# Patient Record
Sex: Female | Born: 1995 | Race: Black or African American | Hispanic: No | Marital: Single | Smoking: Never smoker
Health system: Southern US, Community
[De-identification: ages and names within clinical notes are randomized; demographics above are authoritative.]

---

## 2020-08-22 ENCOUNTER — Emergency Department
Admission: EM | Admit: 2020-08-22 | Discharge: 2020-08-22 | Disposition: A | Discharge status: Home / Self Care | Payer: 59 | Attending: Emergency Medicine | Admitting: Emergency Medicine

## 2020-08-22 ENCOUNTER — Encounter: Payer: Self-pay | Admitting: Emergency Medicine

## 2020-08-22 ENCOUNTER — Other Ambulatory Visit: Payer: Self-pay

## 2020-08-22 DIAGNOSIS — R519 Headache, unspecified: Secondary | ICD-10-CM | POA: Insufficient documentation

## 2020-08-22 DIAGNOSIS — G8929 Other chronic pain: Secondary | ICD-10-CM

## 2020-08-22 LAB — CBC
HCT: 40.8 % (ref 36.0–46.0)
Hemoglobin: 12.7 g/dL (ref 12.0–15.0)
MCH: 24.9 pg — ABNORMAL LOW (ref 26.0–34.0)
MCHC: 31.1 g/dL (ref 30.0–36.0)
MCV: 79.8 fL — ABNORMAL LOW (ref 80.0–100.0)
Platelets: 341 10*3/uL (ref 150–400)
RBC: 5.11 MIL/uL (ref 3.87–5.11)
RDW: 13.7 % (ref 11.5–15.5)
WBC: 4.5 10*3/uL (ref 4.0–10.5)
nRBC: 0 % (ref 0.0–0.2)

## 2020-08-22 LAB — BASIC METABOLIC PANEL
Anion gap: 8 (ref 5–15)
BUN: 19 mg/dL (ref 6–20)
CO2: 23 mmol/L (ref 22–32)
Calcium: 10.3 mg/dL (ref 8.9–10.3)
Chloride: 106 mmol/L (ref 98–111)
Creatinine, Ser: 0.58 mg/dL (ref 0.44–1.00)
GFR, Estimated: >60 mL/min (ref >60)
Glucose, Bld: 96 mg/dL (ref 70–99)
Potassium: 4.9 mmol/L (ref 3.5–5.1)
Sodium: 137 mmol/L (ref 135–145)

## 2020-08-22 MED ORDER — KETOROLAC TROMETHAMINE 30 MG/ML IJ SOLN
30.0000 mg | Freq: Once | INTRAMUSCULAR | Status: AC
  Administered 2020-08-22: 30 mg via INTRAVENOUS
  Filled 2020-08-22: qty 1

## 2020-08-22 NOTE — ED Provider Notes (Signed)
[facility] Emergency Department Provider Note   ____________________________________________    I have reviewed the triage vital signs and the nursing notes.   HISTORY  Chief Complaint Headache     HPI Cassie Pham is a 25 y.o. female who presents with complaints of a headache.  Patient reports that she has had intermittently over the last several months.  She reports extensive work-up at [facility] including an MRI which was normal for the symptoms.  Unable to verify this via care everywhere.  She was sent over from clinic for evaluation because she has multiple complaints primarily headache, also complains of some abdominal pain which is now resolved.  Denies fevers or chills, no dysuria, no nausea or vomiting.  No neuro deficits.  Reports improvement with NSAIDs in headache  History reviewed. No pertinent past medical history.  There are no problems to display for this patient.   History reviewed. No pertinent surgical history.  Prior to Admission medications   Not on File     Allergies Patient has no known allergies.  No family history on file.  Social History Social History   Tobacco Use   Smoking status: Never Smoker   Smokeless tobacco: Never Used  Substance Use Topics   Alcohol use: Never    Review of Systems  Constitutional: No fever/chills Eyes: No visual changes.  ENT: No sore throat. Cardiovascular: Denies chest pain. Respiratory: Denies shortness of breath. Gastrointestinal: As above Genitourinary: Negative for dysuria. Musculoskeletal: Negative for back pain. Skin: Negative for rash. Neurological: As above   ____________________________________________   PHYSICAL EXAM:  VITAL SIGNS: ED Triage Vitals  Enc Vitals Group     BP 08/22/20 1049 130/74     Pulse Rate 08/22/20 1049 (!) 108     Resp 08/22/20 1049 17     Temp 08/22/20 1049 98.4 F (36.9 C)     Temp Source 08/22/20 1049 Oral     SpO2 08/22/20  1049 100 %     Weight 08/22/20 1048 77.1 kg (170 lb)     Height 08/22/20 1048 1.88 m (6\' 2" )     Head Circumference --      Peak Flow --      Pain Score 08/22/20 1107 10     Pain Loc --      Pain Edu? --      Excl. in GC? --     Constitutional: Alert and oriented. No acute distress. Pleasant and interactive Eyes: Conjunctivae are normal.  Head: Atraumatic.  Cardiovascular: Normal rate, regular rhythm. Grossly normal heart sounds.  Good peripheral circulation. Respiratory: Normal respiratory effort.  No retractions Gastrointestinal: Soft and nontender. No distention.  No CVA tenderness.  Musculoskeletal: No lower extremity tenderness nor edema.  Warm and well perfused Neurologic:  Normal speech and language. No gross focal neurologic deficits are appreciated.  Skin:  Skin is warm, dry and intact. No rash noted. Psychiatric: Mood and affect are normal. Speech and behavior are normal.  ____________________________________________   LABS (all labs ordered are listed, but only abnormal results are displayed)  Labs Reviewed  CBC - Abnormal; Notable for the following components:      Result Value   MCV 79.8 (*)    MCH 24.9 (*)    All other components within normal limits  BASIC METABOLIC PANEL  URINALYSIS, COMPLETE (UACMP) WITH MICROSCOPIC   ____________________________________________  EKG   ____________________________________________  RADIOLOGY  None ____________________________________________   PROCEDURES  Procedure(s) performed: No  Procedures  Critical Care performed: No ____________________________________________   INITIAL IMPRESSION / ASSESSMENT AND PLAN / ED COURSE  Pertinent labs & imaging results that were available during my care of the patient were reviewed by me and considered in my medical decision making (see chart for details).  Patient presents with complaints as noted above, global throbbing headache better with lights off, positive  photosensitivity, treated with IM Toradol here with significant improvement.  Abdominal exam is benign  Lab work checked which is reassuring  No indication for emergent imaging at this time given recent extensive work-up, appropriate for discharge with outpatient follow-up    ____________________________________________   FINAL CLINICAL IMPRESSION(S) / ED DIAGNOSES  Final diagnoses:  Chronic nonintractable headache, unspecified headache type        Note:  This document was prepared using Dragon voice recognition software and may include unintentional dictation errors.   Jene Every, MD 08/22/20 [phone]

## 2020-08-22 NOTE — ED Triage Notes (Signed)
Patient to ER for c/o headache to right temple area. Patient also describes tingling sensation to both legs at back of legs.

## 2021-01-24 ENCOUNTER — Encounter: Payer: Self-pay | Admitting: Emergency Medicine

## 2021-01-24 ENCOUNTER — Other Ambulatory Visit: Payer: Self-pay

## 2021-01-24 ENCOUNTER — Emergency Department
Admission: EM | Admit: 2021-01-24 | Discharge: 2021-01-24 | Disposition: A | Discharge status: Home / Self Care | Payer: 59 | Attending: Emergency Medicine | Admitting: Emergency Medicine

## 2021-01-24 DIAGNOSIS — L249 Irritant contact dermatitis, unspecified cause: Secondary | ICD-10-CM | POA: Diagnosis not present

## 2021-01-24 DIAGNOSIS — R21 Rash and other nonspecific skin eruption: Secondary | ICD-10-CM | POA: Diagnosis present

## 2021-01-24 MED ORDER — TRIAMCINOLONE ACETONIDE 0.5 % EX OINT: 1.0000 "application " | TOPICAL_OINTMENT | Freq: Two times a day (BID) | CUTANEOUS | 0 refills | Status: DC

## 2021-01-24 NOTE — ED Provider Notes (Signed)
[facility] Emergency Department Provider Note   ____________________________________________    I have reviewed the triage vital signs and the nursing notes.   HISTORY  Chief Complaint Rash     HPI Cassie Pham is a 25 y.o. female who presents with complaints of rash to the tops of her feet bilaterally.  Patient reports this rash has been ongoing for the last month nearly ever since she was at the beach.  She wonders if the ocean water had something to do with it.  She has been putting an antifungal cream which may be helping over the last week.  No other complaints.  She describes the rash is quite itchy  History reviewed. No pertinent past medical history.  There are no problems to display for this patient.   History reviewed. No pertinent surgical history.  Prior to Admission medications   Medication Sig Start Date End Date Taking? Authorizing Provider  triamcinolone ointment (KENALOG) 0.5 % Apply 1 application topically 2 (two) times daily. 01/24/21  Yes Jene Every, MD     Allergies Patient has no known allergies.  No family history on file.  Social History Social History   Tobacco Use   Smoking status: Never   Smokeless tobacco: Never  Vaping Use   Vaping Use: Never used  Substance Use Topics   Alcohol use: Never    Review of Systems  Constitutional: No fever/chills      Musculoskeletal: No foot swelling, no joint Skin: As above     ____________________________________________   PHYSICAL EXAM:  VITAL SIGNS: ED Triage Vitals [01/24/21 1940]  Enc Vitals Group     BP      Pulse      Resp      Temp      Temp src      SpO2      Weight 81.6 kg (180 lb)     Height 1.88 m (6\' 2" )     Head Circumference      Peak Flow      Pain Score 0     Pain Loc      Pain Edu?      Excl. in GC?      Constitutional: Alert and oriented. No acute distress. Pleasant and interactive Eyes: Conjunctivae are normal.   Head: Atraumatic. Nose: No congestion/rhinnorhea. Mouth/Throat: Mucous membranes are moist.   Cardiovascular: Normal rate, regular rhythm.  Respiratory: Normal respiratory effort.  No retractions. Genitourinary: deferred Musculoskeletal: No lower extremity tenderness nor edema.   Neurologic:  Normal speech and language. No gross focal neurologic deficits are appreciated.   Skin:  Skin is warm, dry, raised rash, nonerythematous to the tops of both feet in the distribution of a sandal suspicious for contact dermatitis   ____________________________________________   LABS (all labs ordered are listed, but only abnormal results are displayed)  Labs Reviewed - No data to display ____________________________________________  EKG   ____________________________________________  RADIOLOGY   ____________________________________________   PROCEDURES  Procedure(s) performed: No  Procedures   Critical Care performed: No ____________________________________________   INITIAL IMPRESSION / ASSESSMENT AND PLAN / ED COURSE  Pertinent labs & imaging results that were available during my care of the patient were reviewed by me and considered in my medical decision making (see chart for details).   Exam is suspicious for contact dermatitis, will treat with steroid cream, outpatient follow-up as needed.   ____________________________________________   FINAL CLINICAL IMPRESSION(S) / ED DIAGNOSES  Final diagnoses:  Irritant  contact dermatitis, unspecified trigger      NEW MEDICATIONS STARTED DURING THIS VISIT:  New Prescriptions   TRIAMCINOLONE OINTMENT (KENALOG) 0.5 %    Apply 1 application topically 2 (two) times daily.     Note:  This document was prepared using Dragon voice recognition software and may include unintentional dictation errors.    Jene Every, MD 01/24/21 2008

## 2021-01-24 NOTE — ED Triage Notes (Signed)
Patient ambulatory to triage with steady gait, without difficulty or distress noted; pt reports itchy rash to top of feet since July after going to the beach

## 2021-03-10 ENCOUNTER — Emergency Department: Payer: 59

## 2021-03-10 ENCOUNTER — Encounter: Payer: Self-pay | Admitting: Emergency Medicine

## 2021-03-10 ENCOUNTER — Other Ambulatory Visit: Payer: Self-pay

## 2021-03-10 ENCOUNTER — Emergency Department
Admission: EM | Admit: 2021-03-10 | Discharge: 2021-03-10 | Disposition: A | Discharge status: Home / Self Care | Payer: 59 | Attending: Emergency Medicine | Admitting: Emergency Medicine

## 2021-03-10 DIAGNOSIS — R200 Anesthesia of skin: Secondary | ICD-10-CM | POA: Insufficient documentation

## 2021-03-10 DIAGNOSIS — M79604 Pain in right leg: Secondary | ICD-10-CM | POA: Diagnosis not present

## 2021-03-10 DIAGNOSIS — Y9367 Activity, basketball: Secondary | ICD-10-CM | POA: Diagnosis not present

## 2021-03-10 DIAGNOSIS — M7031 Other bursitis of elbow, right elbow: Secondary | ICD-10-CM

## 2021-03-10 DIAGNOSIS — M25521 Pain in right elbow: Secondary | ICD-10-CM | POA: Diagnosis present

## 2021-03-10 DIAGNOSIS — M7021 Olecranon bursitis, right elbow: Secondary | ICD-10-CM | POA: Diagnosis not present

## 2021-03-10 MED ORDER — HYDROCODONE-ACETAMINOPHEN 5-325 MG PO TABS: 1.0000 | ORAL_TABLET | Freq: Four times a day (QID) | ORAL | 0 refills | Status: DC | PRN

## 2021-03-10 MED ORDER — KETOROLAC TROMETHAMINE 60 MG/2ML IM SOLN
30.0000 mg | Freq: Once | INTRAMUSCULAR | Status: AC
  Administered 2021-03-10: 30 mg via INTRAMUSCULAR
  Filled 2021-03-10: qty 2

## 2021-03-10 MED ORDER — ACETAMINOPHEN 325 MG PO TABS
650.0000 mg | ORAL_TABLET | Freq: Once | ORAL | Status: AC
  Administered 2021-03-10: 650 mg via ORAL
  Filled 2021-03-10: qty 2

## 2021-03-10 MED ORDER — NAPROXEN 500 MG PO TABS: 500.0000 mg | ORAL_TABLET | Freq: Two times a day (BID) | ORAL | 0 refills | Status: DC

## 2021-03-10 NOTE — ED Notes (Signed)
Pt to desk to request pain medication care discussed with Dr. Dolores Frame, per Dr. Dolores Frame pt offered Tylenol. Pt rolled eyes and walked away from desk without response.

## 2021-03-10 NOTE — ED Provider Notes (Signed)
[facility] Emergency Department Provider Note   ____________________________________________   Event Date/Time   First MD Initiated Contact with Patient 03/10/21 [phone]     (approximate)  I have reviewed the triage vital signs and the nursing notes.   HISTORY  Chief Complaint Elbow Pain    HPI Cassie Pham is a 25 y.o. female who presents to the ED from home with a chief complaint of nontraumatic right elbow pain x1 month.  Exacerbated with movement.  Patient recently started playing basketball and the pain began at that time.  Initially complained of right leg pain to triage nurse but states right elbow pain is her chief complaint tonight.  She is right-hand dominant.  Complains of occasional numbness to her right pinky finger.  Denies extremity weakness.  Voices no other complaints or injuries.     Past medical history None  There are no problems to display for this patient.   History reviewed. No pertinent surgical history.  Prior to Admission medications   Medication Sig Start Date End Date Taking? Authorizing Provider  HYDROcodone-acetaminophen (NORCO) 5-325 MG tablet Take 1 tablet by mouth every 6 (six) hours as needed for moderate pain. 03/10/21  Yes Irean Hong, MD  naproxen (NAPROSYN) 500 MG tablet Take 1 tablet (500 mg total) by mouth 2 (two) times daily with a meal. 03/10/21  Yes Irean Hong, MD  triamcinolone ointment (KENALOG) 0.5 % Apply 1 application topically 2 (two) times daily. 01/24/21   Jene Every, MD    Allergies Patient has no known allergies.  History reviewed. No pertinent family history.  Social History Social History   Tobacco Use   Smoking status: Never   Smokeless tobacco: Never  Vaping Use   Vaping Use: Never used  Substance Use Topics   Alcohol use: Never    Review of Systems  Constitutional: No fever/chills Eyes: No visual changes. ENT: No sore throat. Cardiovascular: Denies chest  pain. Respiratory: Denies shortness of breath. Gastrointestinal: No abdominal pain.  No nausea, no vomiting.  No diarrhea.  No constipation. Genitourinary: Negative for dysuria. Musculoskeletal: Positive for right elbow pain.  Negative for back pain. Skin: Negative for rash. Neurological: Negative for headaches, focal weakness or numbness.   ____________________________________________   PHYSICAL EXAM:  VITAL SIGNS: ED Triage Vitals  Enc Vitals Group     BP 03/10/21 0416 (!) 164/86     Pulse Rate 03/10/21 0416 (!) 123     Resp 03/10/21 0416 20     Temp 03/10/21 0416 98 F (36.7 C)     Temp Source 03/10/21 0416 Oral     SpO2 03/10/21 0416 99 %     Weight --      Height --      Head Circumference --      Peak Flow --      Pain Score 03/10/21 0411 10     Pain Loc --      Pain Edu? --      Excl. in GC? --     Constitutional: Alert and oriented. Well appearing and in no acute distress. Eyes: Conjunctivae are normal. PERRL. EOMI. Head: Atraumatic. Nose: No congestion/rhinnorhea. Mouth/Throat: Mucous membranes are moist.   Neck: No stridor.   Cardiovascular: Normal rate, regular rhythm. Grossly normal heart sounds.  Good peripheral circulation. Respiratory: Normal respiratory effort.  No retractions. Lungs CTAB. Gastrointestinal: Soft and nontender. No distention. No abdominal bruits. No CVA tenderness. Musculoskeletal:  RUE: Right olecranon tender to palpation.  No appreciable swelling.  Limited range of motion secondary to pain.  2+ radial pulse.  Brisk, less than 5-second capillary refill. No lower extremity tenderness nor edema.  No joint effusions. Neurologic:  Normal speech and language. No gross focal neurologic deficits are appreciated. No gait instability. Skin:  Skin is warm, dry and intact. No rash noted. Psychiatric: Mood and affect are normal. Speech and behavior are normal.  ____________________________________________   LABS (all labs ordered are listed,  but only abnormal results are displayed)  Labs Reviewed - No data to display ____________________________________________  EKG  None ____________________________________________  RADIOLOGY I, Ademide Schaberg J, personally viewed and evaluated these images (plain radiographs) as part of my medical decision making, as well as reviewing the written report by the radiologist.  ED MD interpretation: Negative right elbow x-ray  Official radiology report(s): DG Elbow Complete Right  Result Date: 03/10/2021 CLINICAL DATA:  Elbow pain EXAM: RIGHT ELBOW - COMPLETE 3+ VIEW COMPARISON:  None. FINDINGS: There is no evidence of fracture, dislocation, or joint effusion. There is no evidence of arthropathy or other focal bone abnormality. Soft tissues are unremarkable. IMPRESSION: Negative. Electronically Signed   By: Guadlupe Spanish M.D.   On: 03/10/2021 05:03    ____________________________________________   PROCEDURES  Procedure(s) performed (including Critical Care):  Procedures   ____________________________________________   INITIAL IMPRESSION / ASSESSMENT AND PLAN / ED COURSE  As part of my medical decision making, I reviewed the following data within the electronic MEDICAL RECORD NUMBER Nursing notes reviewed and incorporated, Old chart reviewed, Notes from prior ED visits, and [place] Controlled Substance Database     25 year old female presenting with right elbow bursitis.  Will treat with NSAIDs, analgesia, sling and patient will follow up with orthopedics as needed.  Strict return precautions given.  Patient verbalizes understanding and agrees with plan of care.      ____________________________________________   FINAL CLINICAL IMPRESSION(S) / ED DIAGNOSES  Final diagnoses:  Bursitis of right elbow, unspecified bursa     ED Discharge Orders          Ordered    naproxen (NAPROSYN) 500 MG tablet  2 times daily with meals        03/10/21 0543    HYDROcodone-acetaminophen (NORCO)  5-325 MG tablet  Every 6 hours PRN        03/10/21 0543             Note:  This document was prepared using Dragon voice recognition software and may include unintentional dictation errors.    Irean Hong, MD 03/10/21 [phone]

## 2021-03-10 NOTE — ED Triage Notes (Signed)
Pt to ED via POV with c/o R elbow and R leg pain. Pt states pain started last month. Pt denies any known injury. Pt states recently played basketball and pain began. Pt ambulatory to desk with steady even gait at this time.

## 2021-03-10 NOTE — Discharge Instructions (Signed)
1.  You may take pain medicines as needed (Naprosyn/Norco). 2.  Wear sling as needed for comfort. 3.  Return to the ER for worsening symptoms, persistent vomiting, difficulty breathing or other concerns.

## 2021-12-18 ENCOUNTER — Emergency Department
Admission: EM | Admit: 2021-12-18 | Discharge: 2021-12-18 | Disposition: A | Discharge status: Home / Self Care | Payer: 59 | Attending: Emergency Medicine | Admitting: Emergency Medicine

## 2021-12-18 ENCOUNTER — Other Ambulatory Visit: Payer: Self-pay

## 2021-12-18 DIAGNOSIS — Z20822 Contact with and (suspected) exposure to covid-19: Secondary | ICD-10-CM | POA: Diagnosis not present

## 2021-12-18 DIAGNOSIS — Z046 Encounter for general psychiatric examination, requested by authority: Secondary | ICD-10-CM | POA: Diagnosis not present

## 2021-12-18 DIAGNOSIS — M79671 Pain in right foot: Secondary | ICD-10-CM | POA: Diagnosis not present

## 2021-12-18 DIAGNOSIS — Z79899 Other long term (current) drug therapy: Secondary | ICD-10-CM | POA: Insufficient documentation

## 2021-12-18 DIAGNOSIS — F329 Major depressive disorder, single episode, unspecified: Secondary | ICD-10-CM | POA: Diagnosis not present

## 2021-12-18 DIAGNOSIS — F320 Major depressive disorder, single episode, mild: Secondary | ICD-10-CM | POA: Diagnosis not present

## 2021-12-18 DIAGNOSIS — M79672 Pain in left foot: Secondary | ICD-10-CM | POA: Diagnosis not present

## 2021-12-18 DIAGNOSIS — F4321 Adjustment disorder with depressed mood: Secondary | ICD-10-CM | POA: Insufficient documentation

## 2021-12-18 LAB — COMPREHENSIVE METABOLIC PANEL
ALT: 34 U/L (ref 0–44)
AST: 31 U/L (ref 15–41)
Albumin: 4 g/dL (ref 3.5–5.0)
Alkaline Phosphatase: 370 U/L — ABNORMAL HIGH (ref 38–126)
Anion gap: 9 (ref 5–15)
BUN: 12 mg/dL (ref 6–20)
CO2: 25 mmol/L (ref 22–32)
Calcium: 9.3 mg/dL (ref 8.9–10.3)
Chloride: 106 mmol/L (ref 98–111)
Creatinine, Ser: 0.41 mg/dL — ABNORMAL LOW (ref 0.44–1.00)
GFR, Estimated: >60 mL/min (ref >60)
Glucose, Bld: 145 mg/dL — ABNORMAL HIGH (ref 70–99)
Potassium: 3.7 mmol/L (ref 3.5–5.1)
Sodium: 140 mmol/L (ref 135–145)
Total Bilirubin: 0.7 mg/dL (ref 0.3–1.2)
Total Protein: 7.8 g/dL (ref 6.5–8.1)

## 2021-12-18 LAB — URINE DRUG SCREEN, QUALITATIVE (ARMC ONLY)
Amphetamines, Ur Screen: NOT DETECTED
Barbiturates, Ur Screen: NOT DETECTED
Benzodiazepine, Ur Scrn: NOT DETECTED
Cannabinoid 50 Ng, Ur ~~LOC~~: POSITIVE — AB
Cocaine Metabolite,Ur ~~LOC~~: NOT DETECTED
MDMA (Ecstasy)Ur Screen: NOT DETECTED
Methadone Scn, Ur: NOT DETECTED
Opiate, Ur Screen: NOT DETECTED
Phencyclidine (PCP) Ur S: NOT DETECTED
Tricyclic, Ur Screen: NOT DETECTED

## 2021-12-18 LAB — URINALYSIS, ROUTINE W REFLEX MICROSCOPIC
Bilirubin Urine: NEGATIVE
Glucose, UA: NEGATIVE mg/dL
Hgb urine dipstick: NEGATIVE
Ketones, ur: NEGATIVE mg/dL
Leukocytes,Ua: NEGATIVE
Nitrite: NEGATIVE
Protein, ur: NEGATIVE mg/dL
Specific Gravity, Urine: 1.02 (ref 1.005–1.030)
pH: 5 (ref 5.0–8.0)

## 2021-12-18 LAB — CBC WITH DIFFERENTIAL/PLATELET
Abs Immature Granulocytes: 0.03 K/uL (ref 0.00–0.07)
Basophils Absolute: 0 K/uL (ref 0.0–0.1)
Basophils Relative: 0 %
Eosinophils Absolute: 0.1 K/uL (ref 0.0–0.5)
Eosinophils Relative: 1 %
HCT: 39.6 % (ref 36.0–46.0)
Hemoglobin: 11.6 g/dL — ABNORMAL LOW (ref 12.0–15.0)
Immature Granulocytes: 0 %
Lymphocytes Relative: 36 %
Lymphs Abs: 3 K/uL (ref 0.7–4.0)
MCH: 22.6 pg — ABNORMAL LOW (ref 26.0–34.0)
MCHC: 29.3 g/dL — ABNORMAL LOW (ref 30.0–36.0)
MCV: 77.2 fL — ABNORMAL LOW (ref 80.0–100.0)
Monocytes Absolute: 0.7 K/uL (ref 0.1–1.0)
Monocytes Relative: 8 %
Neutro Abs: 4.6 K/uL (ref 1.7–7.7)
Neutrophils Relative %: 55 %
Platelets: 296 K/uL (ref 150–400)
RBC: 5.13 MIL/uL — ABNORMAL HIGH (ref 3.87–5.11)
RDW: 13.2 % (ref 11.5–15.5)
WBC: 8.4 K/uL (ref 4.0–10.5)
nRBC: 0 % (ref 0.0–0.2)

## 2021-12-18 LAB — SALICYLATE LEVEL: Salicylate Lvl: <7 mg/dL — ABNORMAL LOW (ref 7.0–30.0)

## 2021-12-18 LAB — ACETAMINOPHEN LEVEL: Acetaminophen (Tylenol), Serum: <10 ug/mL — ABNORMAL LOW (ref 10–30)

## 2021-12-18 LAB — SARS CORONAVIRUS 2 BY RT PCR: SARS Coronavirus 2 by RT PCR: NEGATIVE

## 2021-12-18 LAB — POC URINE PREG, ED: Preg Test, Ur: NEGATIVE

## 2021-12-18 NOTE — ED Triage Notes (Signed)
Pt arrives with c/o SI that started a few weeks ago. Per pt, she has been depressed about having plantar fascitis. Pt denies having a plan at this time.

## 2021-12-18 NOTE — ED Provider Triage Note (Signed)
Emergency Medicine Provider Triage Evaluation Note  Cassie Pham , a 26 y.o. female  was evaluated in triage.  Pt complains of depression.  Patient brought in by mom who reports that the patient endorsed to her that she had plans to kill herself over the weekend but did not.  Patient reports that she is feeling depressed because she has planter fasciitis that is not getting better.  She has not purchased new shoes.  She denies any attempt at self-harm.  She reports that she did not have a plan, but thought about potentially taking pills.  She denies any ingestions.  Denies HI and AVH.  Review of Systems  Positive: SI, bilateral foot pain Negative: HI, AVH  Physical Exam  BP (!) 165/87 (BP Location: Left Arm)   Pulse (!) 117   Temp 98.5 F (36.9 C) (Oral)   Resp 18   Ht 6\' 2"  (1.88 m)   SpO2 100%   BMI 23.11 kg/m  Gen:   Awake, no distress   Resp:  Normal effort  MSK:   Moves extremities without difficulty  Other:    Medical Decision Making  Medically screening exam initiated at 7:06 PM.  Appropriate orders placed.  Sylwia Cuervo was informed that the remainder of the evaluation will be completed by another provider, this initial triage assessment does not replace that evaluation, and the importance of remaining in the ED until their evaluation is complete.     Pamalee Leyden 12/18/21 1914

## 2021-12-18 NOTE — ED Notes (Signed)
Pt Belongings Shoes Black socks Black shorts Underwear Tshirt Hat    Pt did give mother wallet, necklace, and earrings.

## 2021-12-18 NOTE — ED Provider Notes (Signed)
[facility] Provider Note    Event Date/Time   First MD Initiated Contact with Patient 12/18/21 2034     (approximate)   History   Chief Complaint: Psychiatric Evaluation   HPI  Yanira Tolsma is a 26 y.o. female with no significant past medical history who comes ED complaining of bilateral foot pain for the past year.  She states that she feels depressed about having the bilateral foot pain because it makes her hard to do things that she likes doing.  Denies SI HI hallucinations or any intent to harm herself.  No self-injurious behavior so far.  No trauma.     Physical Exam   Triage Vital Signs: ED Triage Vitals  Enc Vitals Group     BP 12/18/21 1904 (!) 165/87     Pulse Rate 12/18/21 1904 (!) 117     Resp 12/18/21 1904 18     Temp 12/18/21 1904 98.5 F (36.9 C)     Temp Source 12/18/21 1904 Oral     SpO2 12/18/21 1904 100 %     Weight 12/18/21 1914 200 lb (90.7 kg)     Height 12/18/21 1905 6\' 2"  (1.88 m)     Head Circumference --      Peak Flow --      Pain Score 12/18/21 1914 10     Pain Loc --      Pain Edu? --      Excl. in GC? --     Most recent vital signs: Vitals:   12/18/21 1904  BP: (!) 165/87  Pulse: (!) 117  Resp: 18  Temp: 98.5 F (36.9 C)  SpO2: 100%    General: Awake, no distress.  CV:  Good peripheral perfusion.  Resp:  Normal effort.  Abd:  No distention.  Other:  Bilateral feet are nontender.  No pain with dorsiflexion or plantarflexion.  No inflammatory changes or swelling.  No bony tenderness.   ED Results / Procedures / Treatments   Labs (all labs ordered are listed, but only abnormal results are displayed) Labs Reviewed  CBC WITH DIFFERENTIAL/PLATELET - Abnormal; Notable for the following components:      Result Value   RBC 5.13 (*)    Hemoglobin 11.6 (*)    MCV 77.2 (*)    MCH 22.6 (*)    MCHC 29.3 (*)    All other components within normal limits  SALICYLATE LEVEL - Abnormal; Notable for the  following components:   Salicylate Lvl <7.0 (*)    All other components within normal limits  ACETAMINOPHEN LEVEL - Abnormal; Notable for the following components:   Acetaminophen (Tylenol), Serum <10 (*)    All other components within normal limits  URINALYSIS, ROUTINE W REFLEX MICROSCOPIC - Abnormal; Notable for the following components:   Color, Urine YELLOW (*)    APPearance CLEAR (*)    All other components within normal limits  COMPREHENSIVE METABOLIC PANEL - Abnormal; Notable for the following components:   Glucose, Bld 145 (*)    Creatinine, Ser 0.41 (*)    Alkaline Phosphatase 370 (*)    All other components within normal limits  URINE DRUG SCREEN, QUALITATIVE ([facility] ONLY) - Abnormal; Notable for the following components:   Cannabinoid 50 Ng, Ur Early POSITIVE (*)    All other components within normal limits  SARS CORONAVIRUS 2 BY RT PCR  POC URINE PREG, ED     EKG    RADIOLOGY    PROCEDURES:  Procedures  MEDICATIONS ORDERED IN ED: Medications - No data to display   IMPRESSION / MDM / ASSESSMENT AND PLAN / ED COURSE  I reviewed the triage vital signs and the nursing notes.                                  Patient complains of bilateral foot pain.  Exam is unimpressive.  She does have high arches and I suggested getting shoes with good arch support or using orthotic inserts.  Psychiatry has evaluated the patient and does not believe she presents any safety risk and is appropriate for outpatient management.  They have provided her resources.       FINAL CLINICAL IMPRESSION(S) / ED DIAGNOSES   Final diagnoses:  Pain in both feet     Rx / DC Orders   ED Discharge Orders     None        Note:  This document was prepared using Dragon voice recognition software and may include unintentional dictation errors.   Sharman Cheek, MD 12/18/21 2312

## 2021-12-18 NOTE — ED Notes (Signed)
E-signature not working at this time. Pt verbalized understanding of D/C instructions, prescriptions and follow up care with no further questions at this time. Pt in NAD and ambulatory at time of D/C.  

## 2021-12-18 NOTE — BH Assessment (Signed)
Comprehensive Clinical Assessment (CCA) Screening, Triage and Referral Note  12/18/2021 Cassie Pham [id number] Recommendations for Services/Supports/Treatments: Psych NP Annice Pih T. determined pt. does not meet psychiatric inpatient criteria. Pt can be discharged with outpatient resources. Cassie Pham is a 26 year old., Black, Non-Hispanic, English speaking female with no known psych hx. Pt is voluntary. Per triage note: Pt arrives with c/o SI that started a few weeks ago. Per pt., she has been depressed about having plantar fasciitis. Pt denies having a plan at this time.  Upon assessment, Pt was forthcoming about having depression that stems from a change in her quality of life due to worsening physical pain. Pt reported she was very active prior to her injury and she has no ability to do much of anything. Pt described her depression as situational explaining that if she did not have plantar fasciitis, she would not be depressed. Pt reported feelings of despair due to a lack of improvement of her symptoms despite following her doctor's recommendations. Pt denied having sleep disturbance. Pt is not connected to any services. Pt had good judgement and insight. Pt adequate distorted reality testing. Pt had an appropriate mood and a congruent affect. Pt had coherent speech; thoughts were appropriate to context. Pt had normal psychomotor activity. Pt did not appear to be responding to internal stimuli nor did he present with any delusional thinking. Pt denied current SI/HI/AV/H. Pt's UDS/BAL were unremarkable.  Chief Complaint:  Chief Complaint  Patient presents with   Psychiatric Evaluation   Visit Diagnosis: Adjustment disorder with depressed mood  Patient Reported Information How did you hear about [place]? Self  What Is the Reason for Your Visit/Call Today? Pt arrives with c/o SI that started a few weeks ago. Per pt, she has been depressed about having plantar fascitis. Pt denies having a plan  at this time.  How Long Has This Been Causing You Problems? 1 wk - 1 month  What Do You Feel Would Help You the Most Today? Treatment for Depression or other mood problem   Have You Recently Had Any Thoughts About Hurting Yourself? No  Are You Planning to Commit Suicide/Harm Yourself At This time? No   Have you Recently Had Thoughts About Hurting Someone Cassie Pham? No  Are You Planning to Harm Someone at This Time? No  Explanation: No data recorded  Have You Used Any Alcohol or Drugs in the Past 24 Hours? No  How Long Ago Did You Use Drugs or Alcohol? No data recorded What Did You Use and How Much? No data recorded  Do You Currently Have a Therapist/Psychiatrist? No  Name of Therapist/Psychiatrist: No data recorded  Have You Been Recently Discharged From Any Office Practice or Programs? No  Explanation of Discharge From Practice/Program: No data recorded   CCA Screening Triage Referral Assessment Type of Contact: Face-to-Face  Telemedicine Service Delivery:   Is this Initial or Reassessment? No data recorded Date Telepsych consult ordered in [facility]:  No data recorded Time Telepsych consult ordered in [facility]:  No data recorded Location of Assessment: [facility] ED  Provider Location: [facility] ED   Collateral Involvement: None provided   Does Patient Have a Court Appointed Legal Guardian? No data recorded Name and Contact of Legal Guardian: No data recorded If Minor and Not Living with Parent(s), Who has Custody? n/a  Is CPS involved or ever been involved? Never  Is APS involved or ever been involved? Never   Patient Determined To Be At Risk for Harm To Self or Others Based on  Review of Patient Reported Information or Presenting Complaint? No  Method: No data recorded Availability of Means: No data recorded Intent: No data recorded Notification Required: No data recorded Additional Information for Danger to Others Potential: No data recorded Additional Comments for Danger to  Others Potential: No data recorded Are There Guns or Other Weapons in Your Home? No data recorded Types of Guns/Weapons: No data recorded Are These Weapons Safely Secured?                            No data recorded Who Could Verify You Are Able To Have These Secured: No data recorded Do You Have any Outstanding Charges, Pending Court Dates, Parole/Probation? No data recorded Contacted To Inform of Risk of Harm To Self or Others: No data recorded  Does Patient Present under Involuntary Commitment? No  IVC Papers Initial File Date: No data recorded  [place] of Residence: [place]   Patient Currently Receiving the Following Services: Not Receiving Services   Determination of Need: Urgent (48 hours)   Options For Referral: Therapeutic Triage Services; ED Visit   Discharge Disposition:     Lofton Leon R Moriah Loughry, LCAS

## 2021-12-18 NOTE — BH Assessment (Signed)
This Clinical research associate provided patient with outpatient resources. Pt was advised to follow up with outpatient services immediately and to return to the ED if her symptoms worsen.

## 2021-12-19 DIAGNOSIS — F329 Major depressive disorder, single episode, unspecified: Secondary | ICD-10-CM | POA: Diagnosis present

## 2021-12-19 NOTE — Consult Note (Signed)
[facility] Face-to-Face Psychiatry Consult   Reason for Consult: Psychiatric Evaluation Referring Physician: Dr. Scotty Court Patient Identification: Cassie Pham MRN:  [id number] Principal Diagnosis: <principal problem not specified> Diagnosis:  Active Problems:   MDD (major depressive disorder), single episode   Total Time spent with patient: 1 hour  Subjective: "My foot is hurting and it made depressed." Cassie Pham is a 26 y.o. female patient who presented to [facility] ED via POV voluntary. The patient came into ED with complaints of having plantar fasciitis. The patient discussed that the pain has been debilitating to the point where she is depressed and unable to do things. She shared that it has made her depressed. The patient stated she has never suffered from depression. The patient states she recently moved to [place]. The patient stated her depression is due to her foot pain. The patient was asked if she did not have the pain would she be depressed? The patient voiced no.    This provider saw The patient face-to-face; the chart was reviewed, and consulted with Dr. Scotty Court on 12/18/2021 due to the patient's care. It was discussed with the EDP that the patient does not meet the criteria to be admitted to the psychiatric inpatient unit.  On evaluation, the patient is alert and oriented x 4, calm, cooperative, and mood-congruent with affect. The patient does not appear to be responding to internal or external stimuli. Neither is the patient presenting with any delusional thinking. The patient denies auditory or visual hallucinations. The patient denies any suicidal, homicidal, or self-harm ideations. The patient is not presenting with any psychotic or paranoid behaviors. During an encounter with the patient, she could answer questions appropriately.  HPI: Per Dr. Scotty Court, Damarys Speir is a 26 y.o. female with no significant past medical history who comes ED complaining of bilateral foot  pain for the past year.  She states that she feels depressed about having the bilateral foot pain because it makes her hard to do things that she likes doing.  Denies SI HI hallucinations or any intent to harm herself.  No self-injurious behavior so far.  No trauma.  Past Psychiatric History: History reviewed. No pertinent past psychiatric history  Risk to Self:   Risk to Others:   Prior Inpatient Therapy:   Prior Outpatient Therapy:    Past Medical History: History reviewed. No pertinent past medical history. History reviewed. No pertinent surgical history. Family History: History reviewed. No pertinent family history. Family Psychiatric  History:  Social History:  Social History   Substance and Sexual Activity  Alcohol Use Yes     Social History   Substance and Sexual Activity  Drug Use Not Currently    Social History   Socioeconomic History   Marital status: Single    Spouse name: Not on file   Number of children: Not on file   Years of education: Not on file   Highest education level: Not on file  Occupational History   Not on file  Tobacco Use   Smoking status: Never   Smokeless tobacco: Never  Vaping Use   Vaping Use: Never used  Substance and Sexual Activity   Alcohol use: Yes   Drug use: Not Currently   Sexual activity: Not on file  Other Topics Concern   Not on file  Social History Narrative   Not on file   Social Determinants of Health   Financial Resource Strain: Not on file  Food Insecurity: Not on file  Transportation Needs: Not on file  Physical Activity: Not on file  Stress: Not on file  Social Connections: Not on file   Additional Social History:    Allergies:  No Known Allergies  Labs:  Results for orders placed or performed during the hospital encounter of 12/18/21 (from the past 48 hour(s))  CBC with Differential     Status: Abnormal   Collection Time: 12/18/21  7:18 PM  Result Value Ref Range   WBC 8.4 4.0 - 10.5 K/uL   RBC 5.13  (H) 3.87 - 5.11 MIL/uL   Hemoglobin 11.6 (L) 12.0 - 15.0 g/dL   HCT 49.4 49.6 - 75.9 %   MCV 77.2 (L) 80.0 - 100.0 fL   MCH 22.6 (L) 26.0 - 34.0 pg   MCHC 29.3 (L) 30.0 - 36.0 g/dL   RDW 16.3 84.6 - 65.9 %   Platelets 296 150 - 400 K/uL   nRBC 0.0 0.0 - 0.2 %   Neutrophils Relative % 55 %   Neutro Abs 4.6 1.7 - 7.7 K/uL   Lymphocytes Relative 36 %   Lymphs Abs 3.0 0.7 - 4.0 K/uL   Monocytes Relative 8 %   Monocytes Absolute 0.7 0.1 - 1.0 K/uL   Eosinophils Relative 1 %   Eosinophils Absolute 0.1 0.0 - 0.5 K/uL   Basophils Relative 0 %   Basophils Absolute 0.0 0.0 - 0.1 K/uL   Immature Granulocytes 0 %   Abs Immature Granulocytes 0.03 0.00 - 0.07 K/uL    Comment: Performed at [facility], [address] [place], [place] [address]  Salicylate level     Status: Abnormal   Collection Time: 12/18/21  7:18 PM  Result Value Ref Range   Salicylate Lvl <7.0 (L) 7.0 - 30.0 mg/dL    Comment: Performed at [facility], [address] [place], [place] [address]  Acetaminophen level     Status: Abnormal   Collection Time: 12/18/21  7:18 PM  Result Value Ref Range   Acetaminophen (Tylenol), Serum <10 (L) 10 - 30 ug/mL    Comment: (NOTE) Therapeutic concentrations vary significantly. A range of 10-30 ug/mL  may be an effective concentration for many patients. However, some  are best treated at concentrations outside of this range. Acetaminophen concentrations >150 ug/mL at 4 hours after ingestion  and >50 ug/mL at 12 hours after ingestion are often associated with  toxic reactions.  Performed at [facility], [address] [place], [place] [address]   Urinalysis, Routine w reflex microscopic     Status: Abnormal   Collection Time: 12/18/21  7:18 PM  Result Value Ref Range   Color, Urine YELLOW (A) YELLOW   APPearance CLEAR (A) CLEAR   Specific Gravity, Urine 1.020 1.005 - 1.030   pH 5.0 5.0 - 8.0   Glucose, UA NEGATIVE NEGATIVE mg/dL   Hgb urine  dipstick NEGATIVE NEGATIVE   Bilirubin Urine NEGATIVE NEGATIVE   Ketones, ur NEGATIVE NEGATIVE mg/dL   Protein, ur NEGATIVE NEGATIVE mg/dL   Nitrite NEGATIVE NEGATIVE   Leukocytes,Ua NEGATIVE NEGATIVE    Comment: Performed at [facility], [address] [place], [place] [address]  Comprehensive metabolic panel     Status: Abnormal   Collection Time: 12/18/21  7:18 PM  Result Value Ref Range   Sodium 140 135 - 145 mmol/L   Potassium 3.7 3.5 - 5.1 mmol/L   Chloride 106 98 - 111 mmol/L   CO2 25 22 - 32 mmol/L   Glucose, Bld 145 (H) 70 - 99  mg/dL    Comment: Glucose reference range applies only to samples taken after fasting for at least 8 hours.   BUN 12 6 - 20 mg/dL   Creatinine, Ser 0.09 (L) 0.44 - 1.00 mg/dL   Calcium 9.3 8.9 - 38.1 mg/dL   Total Protein 7.8 6.5 - 8.1 g/dL   Albumin 4.0 3.5 - 5.0 g/dL   AST 31 15 - 41 U/L   ALT 34 0 - 44 U/L   Alkaline Phosphatase 370 (H) 38 - 126 U/L   Total Bilirubin 0.7 0.3 - 1.2 mg/dL   GFR, Estimated >82 >99 mL/min    Comment: (NOTE) Calculated using the CKD-EPI Creatinine Equation (2021)    Anion gap 9 5 - 15    Comment: Performed at [facility], [address], [place], [place] [address]  Urine Drug Screen, Qualitative ([facility] only)     Status: Abnormal   Collection Time: 12/18/21  7:18 PM  Result Value Ref Range   Tricyclic, Ur Screen NONE DETECTED NONE DETECTED   Amphetamines, Ur Screen NONE DETECTED NONE DETECTED   MDMA (Ecstasy)Ur Screen NONE DETECTED NONE DETECTED   Cocaine Metabolite,Ur [place] NONE DETECTED NONE DETECTED   Opiate, Ur Screen NONE DETECTED NONE DETECTED   Phencyclidine (PCP) Ur S NONE DETECTED NONE DETECTED   Cannabinoid 50 Ng, Ur [place] POSITIVE (A) NONE DETECTED   Barbiturates, Ur Screen NONE DETECTED NONE DETECTED   Benzodiazepine, Ur Scrn NONE DETECTED NONE DETECTED   Methadone Scn, Ur NONE DETECTED NONE DETECTED    Comment: (NOTE) Tricyclics + metabolites, urine    Cutoff 1000  ng/mL Amphetamines + metabolites, urine  Cutoff 1000 ng/mL MDMA (Ecstasy), urine              Cutoff 500 ng/mL Cocaine Metabolite, urine          Cutoff 300 ng/mL Opiate + metabolites, urine        Cutoff 300 ng/mL Phencyclidine (PCP), urine         Cutoff 25 ng/mL Cannabinoid, urine                 Cutoff 50 ng/mL Barbiturates + metabolites, urine  Cutoff 200 ng/mL Benzodiazepine, urine              Cutoff 200 ng/mL Methadone, urine                   Cutoff 300 ng/mL  The urine drug screen provides only a preliminary, unconfirmed analytical test result and should not be used for non-medical purposes. Clinical consideration and professional judgment should be applied to any positive drug screen result due to possible interfering substances. A more specific alternate chemical method must be used in order to obtain a confirmed analytical result. Gas chromatography / mass spectrometry (GC/MS) is the preferred confirm atory method. Performed at [facility], [address] [place], [place] [address]   SARS Coronavirus 2 by RT PCR (hospital order, performed in [facility] hospital lab) *cepheid single result test* Anterior Nasal Swab     Status: None   Collection Time: 12/18/21  7:20 PM   Specimen: Anterior Nasal Swab  Result Value Ref Range   SARS Coronavirus 2 by RT PCR NEGATIVE NEGATIVE    Comment: (NOTE) SARS-CoV-2 target nucleic acids are NOT DETECTED.  The SARS-CoV-2 RNA is generally detectable in upper and lower respiratory specimens during the acute phase of infection. The lowest concentration of SARS-CoV-2 viral copies this assay can detect is 250  copies / mL. A negative result does not preclude SARS-CoV-2 infection and should not be used as the sole basis for treatment or other patient management decisions.  A negative result may occur with improper specimen collection / handling, submission of specimen other than nasopharyngeal swab, presence of viral mutation(s)  within the areas targeted by this assay, and inadequate number of viral copies (<250 copies / mL). A negative result must be combined with clinical observations, patient history, and epidemiological information.  Fact Sheet for Patients:   RoadLapTop.co.za  Fact Sheet for Healthcare Providers: http://kim-miller.com/  This test is not yet approved or  cleared by the [place] FDA and has been authorized for detection and/or diagnosis of SARS-CoV-2 by FDA under an Emergency Use Authorization (EUA).  This EUA will remain in effect (meaning this test can be used) for the duration of the COVID-19 declaration under Section 564(b)(1) of the Act, 21 U.S.C. section 360bbb-3(b)(1), unless the authorization is terminated or revoked sooner.  Performed at [facility], [address] [place], [place] [address]   POC urine preg, ED     Status: None   Collection Time: 12/18/21  7:37 PM  Result Value Ref Range   Preg Test, Ur NEGATIVE NEGATIVE    Comment:        THE SENSITIVITY OF THIS METHODOLOGY IS >24 mIU/mL     No current facility-administered medications for this encounter.   Current Outpatient Medications  Medication Sig Dispense Refill   HYDROcodone-acetaminophen (NORCO) 5-325 MG tablet Take 1 tablet by mouth every 6 (six) hours as needed for moderate pain. 15 tablet 0   naproxen (NAPROSYN) 500 MG tablet Take 1 tablet (500 mg total) by mouth 2 (two) times daily with a meal. 14 tablet 0   triamcinolone ointment (KENALOG) 0.5 % Apply 1 application topically 2 (two) times daily. 30 g 0    Musculoskeletal: Strength & Muscle Tone: within normal limits Gait & Station: unsteady Patient leans: N/A  Psychiatric Specialty Exam:  Presentation  General Appearance: Appropriate for Environment  Eye Contact:Good  Speech:Clear and Coherent  Speech Volume:Normal  Handedness:Right   Mood and Affect   Mood:Depressed  Affect:Appropriate   Thought Process  Thought Processes:Coherent  Descriptions of Associations:Intact  Orientation:Full (Time, Place and Person)  Thought Content:Logical  History of Schizophrenia/Schizoaffective disorder:No data recorded Duration of Psychotic Symptoms:No data recorded Hallucinations:Hallucinations: None  Ideas of Reference:None  Suicidal Thoughts:Suicidal Thoughts: No  Homicidal Thoughts:Homicidal Thoughts: No   Sensorium  Memory:Immediate Good; Recent Good; Remote Good  Judgment:Good  Insight:Present   Executive Functions  Concentration:Good  Attention Span:Good  Recall:Good  Fund of Knowledge:Good  Language:Good   Psychomotor Activity  Psychomotor Activity:Psychomotor Activity: Normal   Assets  Assets:Communication Skills; Desire for Improvement; Physical Health; Social Support; Resilience   Sleep  Sleep:Sleep: Good   Physical Exam: Physical Exam Vitals and nursing note reviewed.  Constitutional:      Appearance: Normal appearance. She is normal weight.  HENT:     Head: Normocephalic and atraumatic.     Right Ear: External ear normal.     Left Ear: External ear normal.     Nose: Nose normal.     Mouth/Throat:     Mouth: Mucous membranes are moist.  Cardiovascular:     Rate and Rhythm: Normal rate and regular rhythm.     Pulses: Normal pulses.  Pulmonary:     Effort: Pulmonary effort is normal.  Musculoskeletal:        General: Normal range of  motion.     Cervical back: Normal range of motion and neck supple.  Neurological:     General: No focal deficit present.     Mental Status: She is alert and oriented to person, place, and time.  Psychiatric:        Attention and Perception: Attention and perception normal.        Mood and Affect: Affect normal. Mood is depressed.        Behavior: Behavior normal.        Thought Content: Thought content normal.        Judgment: Judgment normal.     ROS Blood pressure 140/70, pulse 100, temperature 98.5 F (36.9 C), temperature source Oral, resp. rate 14, height 6\' 2"  (1.88 m), weight 90.7 kg, SpO2 100 %. Body mass index is 25.68 kg/m.  Treatment Plan Summary: Plan Patient does meet criteria for psychiatric inpatient admission  Disposition: No evidence of imminent risk to self or others at present.   Patient does not meet criteria for psychiatric inpatient admission. Supportive therapy provided about ongoing stressors. Refer to IOP. Discussed crisis plan, support from social network, calling 911, coming to the Emergency Department, and calling Suicide Hotline.  Gillermo MurdochJacqueline Beauden Tremont, NP 12/19/2021 12:26 AM

## 2022-02-17 ENCOUNTER — Other Ambulatory Visit: Payer: Self-pay

## 2022-02-17 ENCOUNTER — Emergency Department: Payer: 59

## 2022-02-17 ENCOUNTER — Emergency Department
Admission: EM | Admit: 2022-02-17 | Discharge: 2022-02-17 | Disposition: A | Discharge status: Home / Self Care | Payer: 59 | Attending: Emergency Medicine | Admitting: Emergency Medicine

## 2022-02-17 DIAGNOSIS — S93601A Unspecified sprain of right foot, initial encounter: Secondary | ICD-10-CM | POA: Diagnosis not present

## 2022-02-17 DIAGNOSIS — W109XXA Fall (on) (from) unspecified stairs and steps, initial encounter: Secondary | ICD-10-CM | POA: Diagnosis not present

## 2022-02-17 DIAGNOSIS — R000 Tachycardia, unspecified: Secondary | ICD-10-CM | POA: Insufficient documentation

## 2022-02-17 DIAGNOSIS — S99921A Unspecified injury of right foot, initial encounter: Secondary | ICD-10-CM | POA: Diagnosis present

## 2022-02-17 MED ORDER — TRAMADOL HCL 50 MG PO TABS: 50.0000 mg | ORAL_TABLET | Freq: Four times a day (QID) | ORAL | 0 refills | Status: AC | PRN

## 2022-02-17 MED ORDER — IBUPROFEN 600 MG PO TABS: 600.0000 mg | ORAL_TABLET | Freq: Four times a day (QID) | ORAL | 0 refills | Status: DC | PRN

## 2022-02-17 MED ORDER — IBUPROFEN 600 MG PO TABS
600.0000 mg | ORAL_TABLET | Freq: Once | ORAL | Status: AC
  Administered 2022-02-17: 600 mg via ORAL
  Filled 2022-02-17: qty 1

## 2022-02-17 NOTE — ED Notes (Signed)
Pt denies any recent mariajuana use- stating its been over a week.

## 2022-02-17 NOTE — Discharge Instructions (Addendum)
Use the crutches as needed.  You may bear weight on the right foot as tolerated.  Follow-up with the podiatrist.  You should take ibuprofen and/or Tylenol as the primary pain medication.  You may take the tramadol if needed over the next couple of days for pain that is not relieved by the ibuprofen or tylenol.  Your heart rate has been high on the last several visits to the ER.  We have given you referral to see a cardiologist for further evaluation.  Return to the ER for new or worsening foot pain or inability to bear weight, or for chest pain, difficulty breathing, palpitations, lightheadedness or feel like you are going to pass out, or any other new or worsening symptoms that concern you.

## 2022-02-17 NOTE — ED Triage Notes (Addendum)
Pt was walking up stairs today when they slipped/fell and their second, third and fourth toes on the R foot "bent all the way back". Pt foot does not appeared to be swollen or disfigured.   While in triage patient appeared tachypnic, and very diaphoretic. Pt hr sustained 135 while in triage. Pt stating they are not sure what is happening, but stated this sometimes happens

## 2022-02-17 NOTE — ED Provider Notes (Signed)
[facility] Provider Note    Event Date/Time   First MD Initiated Contact with Patient 02/17/22 1812     (approximate)   History   Foot Injury   HPI  Cassie Pham is a 26 y.o. female with a history of plantar fasciitis who presents with right foot injury a few hours ago.  The patient states that she fell while going down some stairs and felt the toes pulled back.  She has pain primarily at the base of the third and fourth toe.  She denies any other injuries.  She states that she has been unable to bear weight on the foot since it happened.  The patient denies any other acute symptoms.  She denies chest pain, difficulty breathing, lightheadedness, or palpitations.  When I informed her that her pulse was elevated, she stated that this had been the case during several prior visits recently but she has no symptoms.    Physical Exam   Triage Vital Signs: ED Triage Vitals [02/17/22 1750]  Enc Vitals Group     BP (!) 186/85     Pulse Rate (!) 134     Resp 16     Temp 98.6 F (37 C)     Temp Source Oral     SpO2 95 %     Weight 180 lb (81.6 kg)     Height      Head Circumference      Peak Flow      Pain Score 8     Pain Loc      Pain Edu?      Excl. in GC?     Most recent vital signs: Vitals:   02/17/22 1900 02/17/22 2000  BP: (!) 140/83 138/81  Pulse: 100 (!) 116  Resp: 19 (!) 23  Temp:    SpO2: 100% 100%     General: Alert and oriented, comfortable appearing. CV:  Good peripheral perfusion.  Resp:  Normal effort.  Abd:  No distention.  Other:  Right foot with mild tenderness to third and fourth toes with no deformity.  Normal cap refill.  2+ DP pulse.  Full range of motion at ankle.  Normal motor strength and intact sensation distally.  ED Results / Procedures / Treatments   Labs (all labs ordered are listed, but only abnormal results are displayed) Labs Reviewed - No data to display   EKG  ED ECG REPORT I, Dionne Bucy, the attending physician, personally viewed and interpreted this ECG.  Date: 02/17/2022 EKG Time: 1756 Rate: 121 Rhythm: Sinus tachycardia QRS Axis: normal Intervals: normal ST/T Wave abnormalities: normal Narrative Interpretation: Sinus tachycardia with no evidence of acute ischemia.  Lateral Q waves with no ST abnormalities.  No prior EKG available for comparison.    RADIOLOGY  XR R foot: I independently viewed and interpreted the images; there is no acute fracture  PROCEDURES:  Critical Care performed: No  Procedures   MEDICATIONS ORDERED IN ED: Medications  ibuprofen (ADVIL) tablet 600 mg (600 mg Oral Given 02/17/22 1848)     IMPRESSION / MDM / ASSESSMENT AND PLAN / ED COURSE  I reviewed the triage vital signs and the nursing notes.  26 year old female with PMH as noted above presents with right foot injury.  She was also noted to be tachycardic and slightly diaphoretic in triage.  She is otherwise asymptomatic.  I reviewed the past medical records.  The patient was seen by Dr. Ether Griffins from podiatry on 8/31.  She is on NSAIDs and Tylenol.  She was seen in the ED on 03/10/2021 and 12/18/2021, both times for other symptoms but noted to have tachycardia.  Differential diagnosis includes, but is not limited to, foot sprain, ligament tear, fracture.  The foot is neuro/vascular intact.  Etiology of the tachycardia is unclear but it appears chronic.  The patient is asymptomatic.  Her EKG shows no concerning findings.  There is no indication for further work-up unless the patient demonstrates continued significant tachycardia.  Patient's presentation is most consistent with acute complicated illness / injury requiring diagnostic workup.  The patient is on the cardiac monitor to evaluate for evidence of arrhythmia and/or significant heart rate changes.   ----------------------------------------- 8:08 PM on 02/17/2022 -----------------------------------------  X-ray is  negative.  We will discharge with a hard shoe and crutches.  I referred the patient for podiatry follow-up.  The tachycardia spontaneously improved and the patient's heart rate was under 100 for a while.  It is now up again to the 1 teens although the patient remains asymptomatic.  There is no evidence of acute cardiac process however the patient should get outpatient follow-up and work-up for this persistent tachycardia.  I have ordered cardiology referral.  I counseled the patient on the results of the work-up and gave her thorough return precautions; she expressed understanding.  FINAL CLINICAL IMPRESSION(S) / ED DIAGNOSES   Final diagnoses:  Sprain of right foot, initial encounter  Tachycardia     Rx / DC Orders   ED Discharge Orders          Ordered    Ambulatory referral to Cardiology       Comments: If you have not heard from the Cardiology office within the next 72 hours please call [phone].   02/17/22 2006    traMADol (ULTRAM) 50 MG tablet  Every 6 hours PRN        02/17/22 2007    ibuprofen (ADVIL) 600 MG tablet  Every 6 hours PRN        02/17/22 2007             Note:  This document was prepared using Dragon voice recognition software and may include unintentional dictation errors.    Dionne Bucy, MD 02/17/22 2009

## 2022-03-26 NOTE — Progress Notes (Unsigned)
Cardiology Office Note  Date:  03/27/2022   ID:  Cassie Pham, DOB [birth year], MRN [id number]  PCP:  Pcp, No   Chief Complaint  Patient presents with   New Patient (Initial Visit)    Ref by Dr. Marisa Severin for tachycardia. Patient c/o chest tightness, shortness of breath and tachycardia at times. Medications reviewed by the patient verbally.     HPI:  Ms. Cassie Pham is a 26 year old woman with past medical history of History of near syncope/syncope on heavy exercise at age 66, evaluated in [place] Who presents from Dr. Marisa Severin for tachycardia  Seen in the emergency room February 17, 2022 for foot injury Was tachycardic in the emergency room, rate greater than 100 Was discharged with a hard shoe and crutches  Noted to have tachycardia on ER visit December 18, 2021 rate 117 bpm Tachycardic in the ER March 10, 2021 rate 123 bpm Tachycardic in the ER August 22, 2020 rate 108 beats per minute  No regular exercise  Reports when she was doing workout for basketball age 9 every exertion would have near-syncope, syncope. Details unclear, reports that she was seen in a hospital in [place], records unavailable Had a monitor at that time but details unclear.  She was told her heart rate was irregular  EKG personally reviewed by myself on todays visit Sinus tachycardia rate 132 bpm  PMH:   has no past medical history on file.  PSH:   History reviewed. No pertinent surgical history.  Current Outpatient Medications  Medication Sig Dispense Refill   HYDROcodone-acetaminophen (NORCO) 5-325 MG tablet Take 1 tablet by mouth every 6 (six) hours as needed for moderate pain. (Patient not taking: Reported on 03/27/2022) 15 tablet 0   ibuprofen (ADVIL) 600 MG tablet Take 1 tablet (600 mg total) by mouth every 6 (six) hours as needed. (Patient not taking: Reported on 03/27/2022) 30 tablet 0   naproxen (NAPROSYN) 500 MG tablet Take 1 tablet (500 mg total) by mouth 2 (two) times daily with a  meal. (Patient not taking: Reported on 03/27/2022) 14 tablet 0   triamcinolone ointment (KENALOG) 0.5 % Apply 1 application topically 2 (two) times daily. (Patient not taking: Reported on 03/27/2022) 30 g 0   No current facility-administered medications for this visit.    Allergies:   Patient has no known allergies.   Social History:  The patient  reports that she has never smoked. She has never used smokeless tobacco. She reports current alcohol use. She reports current drug use. Drug: Marijuana.   Family History:   family history includes Diabetes in her mother; Heart disease in her maternal grandfather; Hypertension in her mother.    Review of Systems: Review of Systems  Constitutional: Negative.   HENT: Negative.    Respiratory: Negative.    Cardiovascular: Negative.   Gastrointestinal: Negative.   Musculoskeletal: Negative.   Neurological: Negative.   Psychiatric/Behavioral: Negative.    All other systems reviewed and are negative.   PHYSICAL EXAM: VS:  BP (!) 164/70 (BP Location: Left Arm, Patient Position: Sitting, Cuff Size: Normal)   Pulse (!) 132   Ht 6\' 2"  (1.88 m)   Wt 217 lb (98.4 kg)   SpO2 97%   BMI 27.86 kg/m  , BMI Body mass index is 27.86 kg/m. Constitutional:  oriented to person, place, and time. No distress.  HENT:  Head: Grossly normal Eyes:  no discharge. No scleral icterus.  Neck: No JVD, no carotid bruits  Cardiovascular: Tachycardic, regular rhythm, no murmurs appreciated  Pulmonary/Chest: Clear to auscultation bilaterally, no wheezes or rails Abdominal: Soft.  no distension.  no tenderness.  Musculoskeletal: Normal range of motion Neurological:  normal muscle tone. Coordination normal. No atrophy Skin: Skin warm and dry Psychiatric: normal affect, pleasant   Recent Labs: 12/18/2021: ALT 34; BUN 12; Creatinine, Ser 0.41; Hemoglobin 11.6; Platelets 296; Potassium 3.7; Sodium 140    Lipid Panel No results found for: "CHOL", "HDL", "LDLCALC",  "TRIG"    Wt Readings from Last 3 Encounters:  03/27/22 217 lb (98.4 kg)  02/17/22 180 lb (81.6 kg)  12/18/21 200 lb (90.7 kg)       ASSESSMENT AND PLAN:  Problem List Items Addressed This Visit   None Visit Diagnoses     Sinus tachycardia    -  Primary   Relevant Orders   EKG 12-Lead      Sinus tachycardia Longstanding history per visits to the emergency room Asymptomatic currently, denies any near-syncope or syncope recently but does report having symptoms on heavy exertion when she was 26 years old living in [place] doing aggressive basketball workout Recommended echocardiogram to rule out structural heart disease Zio monitor to evaluate average heart rate and for other arrhythmia  Elevated blood pressure without diagnosis of hypertension Recommend she try to obtain mother's blood pressure cuff and obtain blood pressure readings at home If blood pressure continues to run high, may need blood pressure medication Potentially could use metoprolol succinate or carvedilol to treat heart rate and blood pressure    Total encounter time more than 50 minutes  Greater than 50% was spent in counseling and coordination of care with the patient    Signed, Esmond Plants, M.D., Ph.D. [place], [place]

## 2022-03-27 ENCOUNTER — Ambulatory Visit (INDEPENDENT_AMBULATORY_CARE_PROVIDER_SITE_OTHER): Payer: 59

## 2022-03-27 ENCOUNTER — Ambulatory Visit: Payer: 59 | Attending: Cardiovascular Disease | Admitting: Cardiovascular Disease

## 2022-03-27 ENCOUNTER — Encounter: Payer: Self-pay | Admitting: Cardiovascular Disease

## 2022-03-27 VITALS — BP 158/80 | HR 132 | Ht 74.0 in | Wt 217.0 lb

## 2022-03-27 DIAGNOSIS — R000 Tachycardia, unspecified: Secondary | ICD-10-CM

## 2022-03-27 NOTE — Patient Instructions (Addendum)
Please monitor your blood pressure at home.  Medication Instructions:  No changes  If you need a refill on your cardiac medications before your next appointment, please call your pharmacy.   Lab work: No new labs needed  Testing/Procedures: Your physician has recommended that you wear a Zio monitor.   This monitor is a medical device that records the heart's electrical activity. Doctors most often use these monitors to diagnose arrhythmias. Arrhythmias are problems with the speed or rhythm of the heartbeat. The monitor is a small device applied to your chest. You can wear one while you do your normal daily activities. While wearing this monitor if you have any symptoms to push the button and record what you felt. Once you have worn this monitor for the period of time provider prescribed (Usually 14 days), you will return the monitor device in the postage paid box. Once it is returned they will download the data collected and provide [place] with a report which the provider will then review and we will call you with those results. Important tips:  Avoid showering during the first 24 hours of wearing the monitor. Avoid excessive sweating to help maximize wear time. Do not submerge the device, no hot tubs, and no swimming pools. Keep any lotions or oils away from the patch. After 24 hours you may shower with the patch on. Take brief showers with your back facing the shower head.  Do not remove patch once it has been placed because that will interrupt data and decrease adhesive wear time. Push the button when you have any symptoms and write down what you were feeling. Once you have completed wearing your monitor, remove and place into box which has postage paid and place in your outgoing mailbox.  If for some reason you have misplaced your box then call our office and we can provide another box and/or mail it off for you.  Echocardiogram for tachycardia Your physician has requested that you have an  echocardiogram. Echocardiography is a painless test that uses sound waves to create images of your heart. It provides your doctor with information about the size and shape of your heart and how well your heart's chambers and valves are working. This procedure takes approximately one hour. There are no restrictions for this procedure. Please do NOT wear cologne, perfume, aftershave, or lotions (deodorant is allowed). Please arrive 15 minutes prior to your appointment time.   Follow-Up: At [facility], you and your health needs are our priority.  As part of our continuing mission to provide you with exceptional heart care, we have created designated Provider Care Teams.  These Care Teams include your primary Cardiologist (physician) and Advanced Practice Providers (APPs -  Physician Assistants and Nurse Practitioners) who all work together to provide you with the care you need, when you need it.  You will need a follow up appointment as needed  Providers on your designated Care Team:   Murray Hodgkins, NP Christell Faith, PA-C Cadence Kathlen Mody, [place]  COVID-19 Vaccine Information can be found at: [email or web address] For questions related to vaccine distribution or appointments, please email [email or web address] or call [phone].

## 2022-04-06 DIAGNOSIS — R000 Tachycardia, unspecified: Secondary | ICD-10-CM | POA: Diagnosis not present

## 2022-04-09 ENCOUNTER — Telehealth: Payer: Self-pay | Admitting: Cardiovascular Disease

## 2022-04-09 NOTE — Telephone Encounter (Signed)
Patient stated she had problems with her heart monitor and would like to get a replacement.

## 2022-04-09 NOTE — Telephone Encounter (Signed)
Advised to call Zio and speak with them for issues and a replacement. Pt verbalized understanding and had no additional questions.

## 2022-04-24 ENCOUNTER — Telehealth: Payer: Self-pay | Admitting: Cardiovascular Disease

## 2022-04-24 NOTE — Telephone Encounter (Signed)
   Cardiac Monitor Alert  Date of alert:  04/24/2022   Patient Name: Cassie Pham  DOB: [birth year]  MRN: [id number]   [facility] Cardiologist: None  [facility] EP:  None    Monitor Information: Long Term Monitor [ZioXT]  Reason:  Sinus Tachycardia Ordering provider:  Gollan   Alert Sinus Tachycardia that occurred on 04/06/22 @ 1705 Heart Rate- 232 bpm for 2 minutes This is the 1st alert for this rhythm.   Next Cardiology Appointment1}  Date:  05/18/22  Provider:  Echocardiogram No MD/ APP follow up scheduled  The patient was contacted today.   She reports the following symptoms:  Migraine symptoms that occurred during the reported event.  Arrhythmia, symptoms and history reviewed with Dr. Mariah Milling.   Plan:  Contact the patient. As she only wore this particular monitor from 04/06/22-04/07/22 due to issues with the monitor- make sure she has received an additional monitor. Depending on symptoms, she may need a referral to EP for possible SVT ablation as her rhythm does appear to be SVT.   Other: Contacted the patient. She reports only symptoms of a migraine at the time of the reported tachycardia on 04/06/22. The patient confirms she received her 2nd monitor and has had this on for ~ 1.5 weeks. She denies symptoms of dizziness/ lightheadedness/ shortness of breath/ pre-syncope since the 2nd monitor has been applied.   I advised the patient I will update Dr. Mariah Milling and we will call her back in the interim if needed. Otherwise, we will await the final report from her 2nd monitor. The patient voices understanding and is agreeable.    Sherri Rad, RN  04/24/2022 9:19 AM

## 2022-04-24 NOTE — Telephone Encounter (Signed)
  Christina - [employer] calling to give critical result

## 2022-04-27 NOTE — Telephone Encounter (Signed)
Spoke w/ pt. Advised her of Dr. Windell Hummingbird recommendations. She verbalizes understanding and is agreeable.

## 2022-04-27 NOTE — Telephone Encounter (Signed)
Cassie Iba, MD  P Cv Div Burl Triage  Episode of tachycardia reviewed with Heather at time of patient's migraine headache, rate was elevated, appeared to be 200 bpm lasting for 2 minutes At baseline has sinus tachycardia, was 130 bpm in the office Recommend we wait until results of second monitor is available, we can arrange follow-up in clinic to review Also if she does have blood pressure measurements using her mother's blood pressure cuff, that would be helpful if medications are needed Thx TGollan

## 2022-05-18 ENCOUNTER — Telehealth: Payer: Self-pay

## 2022-05-18 ENCOUNTER — Ambulatory Visit: Payer: 59 | Attending: Cardiovascular Disease

## 2022-05-18 DIAGNOSIS — R000 Tachycardia, unspecified: Secondary | ICD-10-CM

## 2022-05-18 LAB — ECHOCARDIOGRAM COMPLETE
AR max vel: 4.36 cm2
AV Area VTI: 4.63 cm2
AV Area mean vel: 4.09 cm2
AV Mean grad: 8.5 mmHg
AV Peak grad: 15.1 mmHg
Ao pk vel: 1.94 m/s
Area-P 1/2: 7.02 cm2
Calc EF: 56.8 %
S' Lateral: 3.3 cm
Single Plane A2C EF: 57.1 %
Single Plane A4C EF: 57.9 %

## 2022-05-18 MED ORDER — PERFLUTREN LIPID MICROSPHERE
1.0000 mL | INTRAVENOUS | Status: AC | PRN
  Administered 2022-05-18: 2 mL via INTRAVENOUS

## 2022-05-18 NOTE — Telephone Encounter (Signed)
Patient had an Echocardiogram completed today and needed a work note stating she was here today.   Work note was printed and given to patient.

## 2022-06-19 ENCOUNTER — Other Ambulatory Visit: Payer: Self-pay

## 2022-06-19 ENCOUNTER — Emergency Department
Admission: EM | Admit: 2022-06-19 | Discharge: 2022-06-19 | Disposition: A | Discharge status: Home / Self Care | Payer: 59 | Attending: Emergency Medicine | Admitting: Emergency Medicine

## 2022-06-19 DIAGNOSIS — Z5321 Procedure and treatment not carried out due to patient leaving prior to being seen by health care provider: Secondary | ICD-10-CM | POA: Insufficient documentation

## 2022-06-19 DIAGNOSIS — M79672 Pain in left foot: Secondary | ICD-10-CM | POA: Insufficient documentation

## 2022-06-19 DIAGNOSIS — M79671 Pain in right foot: Secondary | ICD-10-CM | POA: Diagnosis not present

## 2022-06-19 NOTE — ED Triage Notes (Signed)
Pt here for bilateral foot pain. Pt has HX of plantar fascitis. Pt f/up with podiatry.

## 2022-06-19 NOTE — ED Provider Triage Note (Signed)
Emergency Medicine Provider Triage Evaluation Note  Cassie Pham, a 27 y.o. female  was evaluated in triage.  Pt complains of bilateral foot pain.  Patient presents with chronic bilateral foot pain with a diagnosis of plantar fasciitis.  She is previously been followed by podiatry.  She presents to the ED requesting further treatment and therapies.  She denies any recent injury, trauma, or falls.  Review of Systems  Positive: Bilateral foot pain Negative: FCS  Physical Exam  BP (!) 159/82 (BP Location: Left Arm)   Pulse (!) 124   Temp 98.6 F (37 C) (Oral)   Resp 18   SpO2 98%  Gen:   Awake, no distress  NAD Resp:  Normal effort  MSK:   Moves extremities without difficulty  Other:    Medical Decision Making  Medically screening exam initiated at 11:35 AM.  Appropriate orders placed.  Cassie Pham was informed that the remainder of the evaluation will be completed by another provider, this initial triage assessment does not replace that evaluation, and the importance of remaining in the ED until their evaluation is complete.  Patient to the ED for evaluation of acute on chronic bilateral foot pain secondary to plantar fasciitis.  She denies any current treatment or therapies at this time.  Patient is also followed incidentally by cardiology for her tachycardia.   Cassie Needles, PA-C 06/19/22 1137

## 2022-07-02 ENCOUNTER — Other Ambulatory Visit: Payer: Self-pay

## 2022-07-02 MED ORDER — METOPROLOL SUCCINATE ER 50 MG PO TB24: 50.0000 mg | ORAL_TABLET | Freq: Every day | ORAL | 11 refills | Status: DC

## 2022-07-02 MED ORDER — METOPROLOL SUCCINATE ER 50 MG PO TB24: 50.0000 mg | ORAL_TABLET | Freq: Every day | ORAL | 11 refills | Status: AC

## 2022-08-07 ENCOUNTER — Ambulatory Visit: Payer: 59 | Admitting: Podiatry

## 2022-08-07 ENCOUNTER — Encounter: Payer: Self-pay | Admitting: Podiatry

## 2022-08-07 ENCOUNTER — Ambulatory Visit (INDEPENDENT_AMBULATORY_CARE_PROVIDER_SITE_OTHER): Payer: 59

## 2022-08-07 VITALS — BP 157/84 | HR 107

## 2022-08-07 DIAGNOSIS — M722 Plantar fascial fibromatosis: Secondary | ICD-10-CM

## 2022-08-07 MED ORDER — BETAMETHASONE SOD PHOS & ACET 6 (3-3) MG/ML IJ SUSP: 3.0000 mg | Freq: Once | INTRAMUSCULAR | Status: DC

## 2022-08-07 MED ORDER — METHYLPREDNISOLONE 4 MG PO TBPK: ORAL_TABLET | ORAL | 0 refills | Status: DC

## 2022-08-07 MED ORDER — MELOXICAM 15 MG PO TABS: 15.0000 mg | ORAL_TABLET | Freq: Every day | ORAL | 1 refills | Status: DC

## 2022-08-07 NOTE — Progress Notes (Signed)
   Chief Complaint  Patient presents with   Foot Pain    "I have Plantar Fasciitis." N - heel pain L - b/l D - 2 yrs O - gradually worse C - throb, ache, sharp pain A -walking and standing a lot T - stretching exercises, ice    Subjective: 27 y.o. female presenting today for evaluation of bilateral heel pain that has been ongoing for about 2 years now.  She states that she has seen Dr. Vickki Muff at [facility] who recommended conservative stretching and icing with tennis ball massage.  She also had seen another physician in [place] but there was no treatment at that time.  Patient is very frustrated because she has had ongoing pain and tenderness for about 2 years now.  She presents today with her mother for further treatment and evaluation   No past medical history on file.   Objective: Physical Exam General: The patient is alert and oriented x3 in no acute distress.  Dermatology: Skin is warm, dry and supple bilateral lower extremities. Negative for open lesions or macerations bilateral.   Vascular: Dorsalis Pedis and Posterior Tibial pulses palpable bilateral.  Capillary fill time is immediate to all digits.  Neurological: Epicritic and protective threshold intact bilateral.   Musculoskeletal: Tenderness to palpation to the plantar aspect of the bilateral heels along the plantar fascia. All other joints range of motion within normal limits bilateral. Strength 5/5 in all groups bilateral.   Radiographic exam B/L feet 08/07/2022: Normal osseous mineralization. Joint spaces preserved. No fracture/dislocation/boney destruction. No other soft tissue abnormalities or radiopaque foreign bodies.   Assessment: 1. plantar fasciitis bilateral feet x 2 years  Plan of Care:  1. Patient evaluated. Xrays reviewed.   2. Injection of 0.5cc Celestone soluspan injected into the bilateral heels.  3. Rx for Medrol Dose Pak placed 4. Rx for Meloxicam 15 mg daily ordered for patient. 5.   OTC power step insoles dispensed at checkout to wear daily in her shoes. 6.  Advised against going barefoot.  Continue daily stretching exercises with ice 7. Return to clinic in 4 weeks.    *grew up in [place]. Played basketball at [employer]. Part [place] and [place]ron [place], DPM [facility]  Dr. Edrick Kins, DPM    [address], [place] [address]                Office [phone]  Fax [phone]      6.    Every day via follow-up a

## 2022-08-10 ENCOUNTER — Telehealth: Payer: Self-pay | Admitting: Podiatry

## 2022-08-23 NOTE — Telephone Encounter (Signed)
Error

## 2022-09-11 ENCOUNTER — Ambulatory Visit: Payer: 59 | Admitting: Podiatry

## 2022-10-03 IMAGING — DX DG ELBOW COMPLETE 3+V*R*
4 series · 4 of 4 positions shown · non-contrast
Comparison: None.

CLINICAL DATA: Elbow pain

EXAM:
RIGHT ELBOW - COMPLETE 3+ VIEW

[elbow ap]
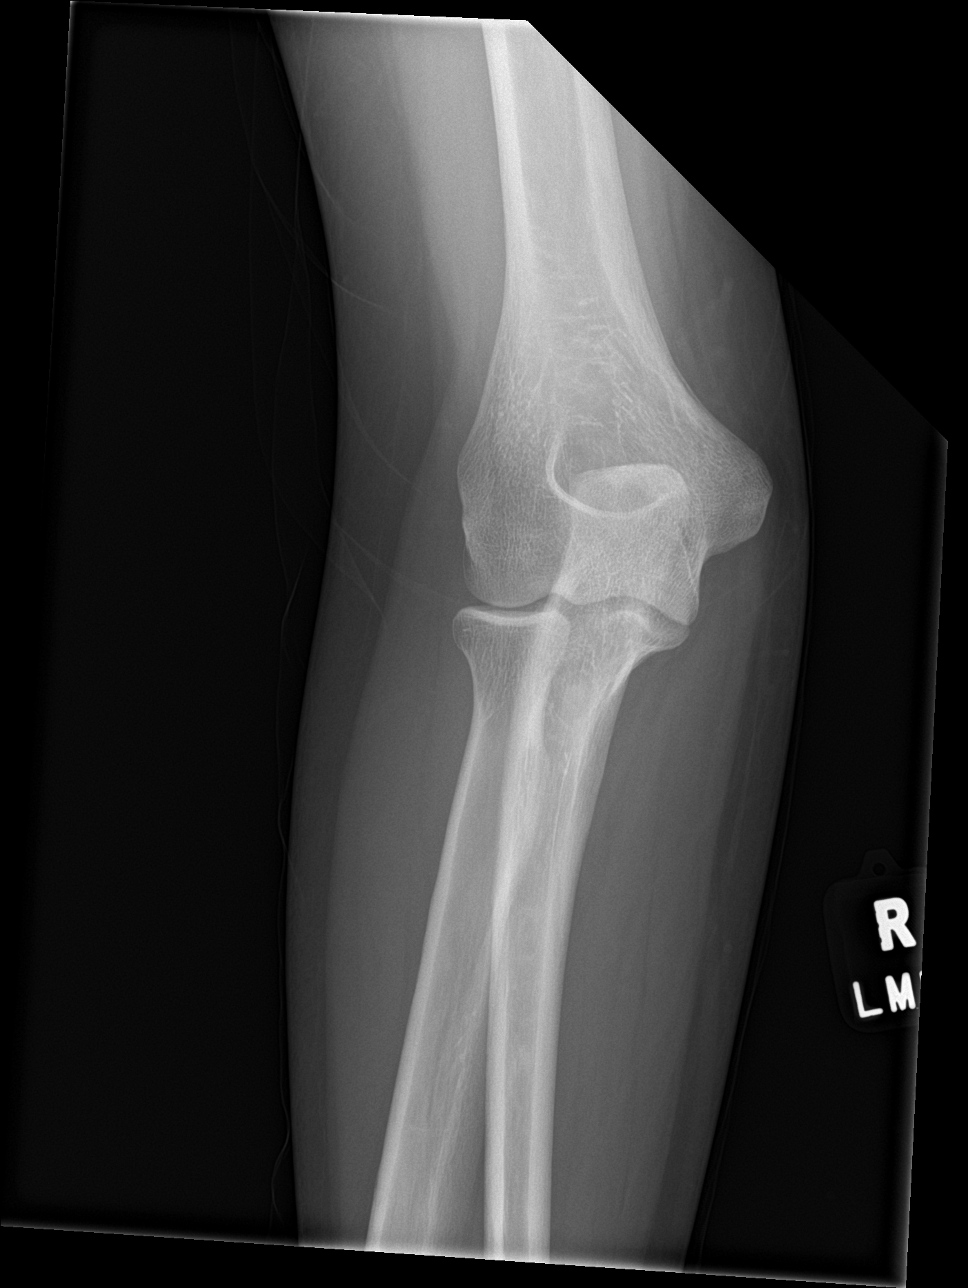

[elbow obl (1 of 2)]
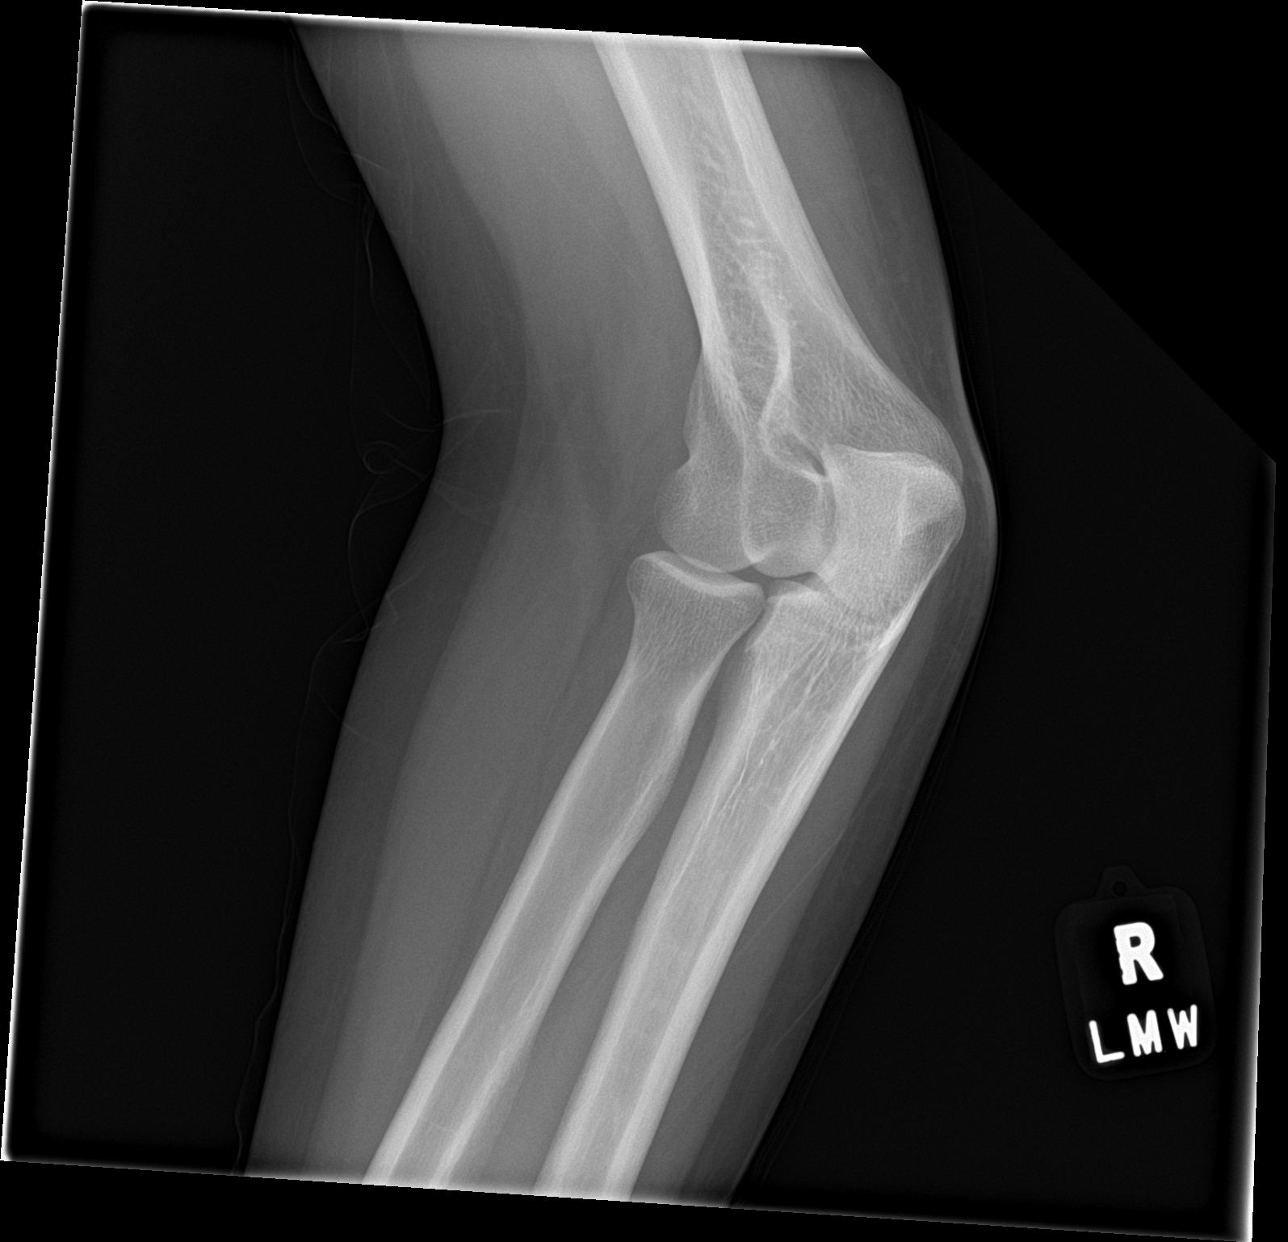

[elbow obl (2 of 2)]
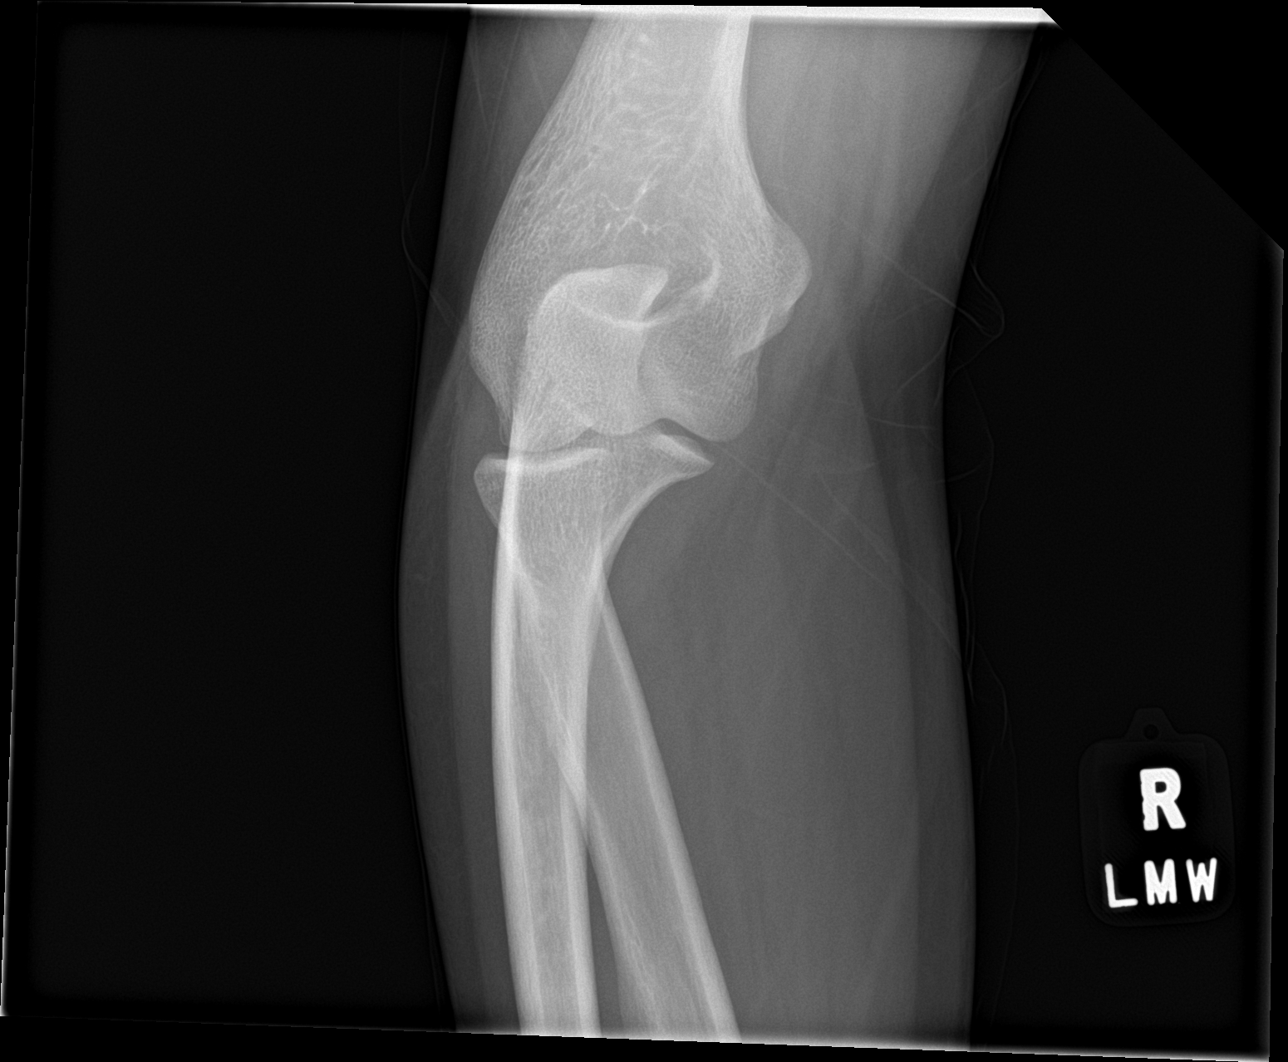

[elbow lat]
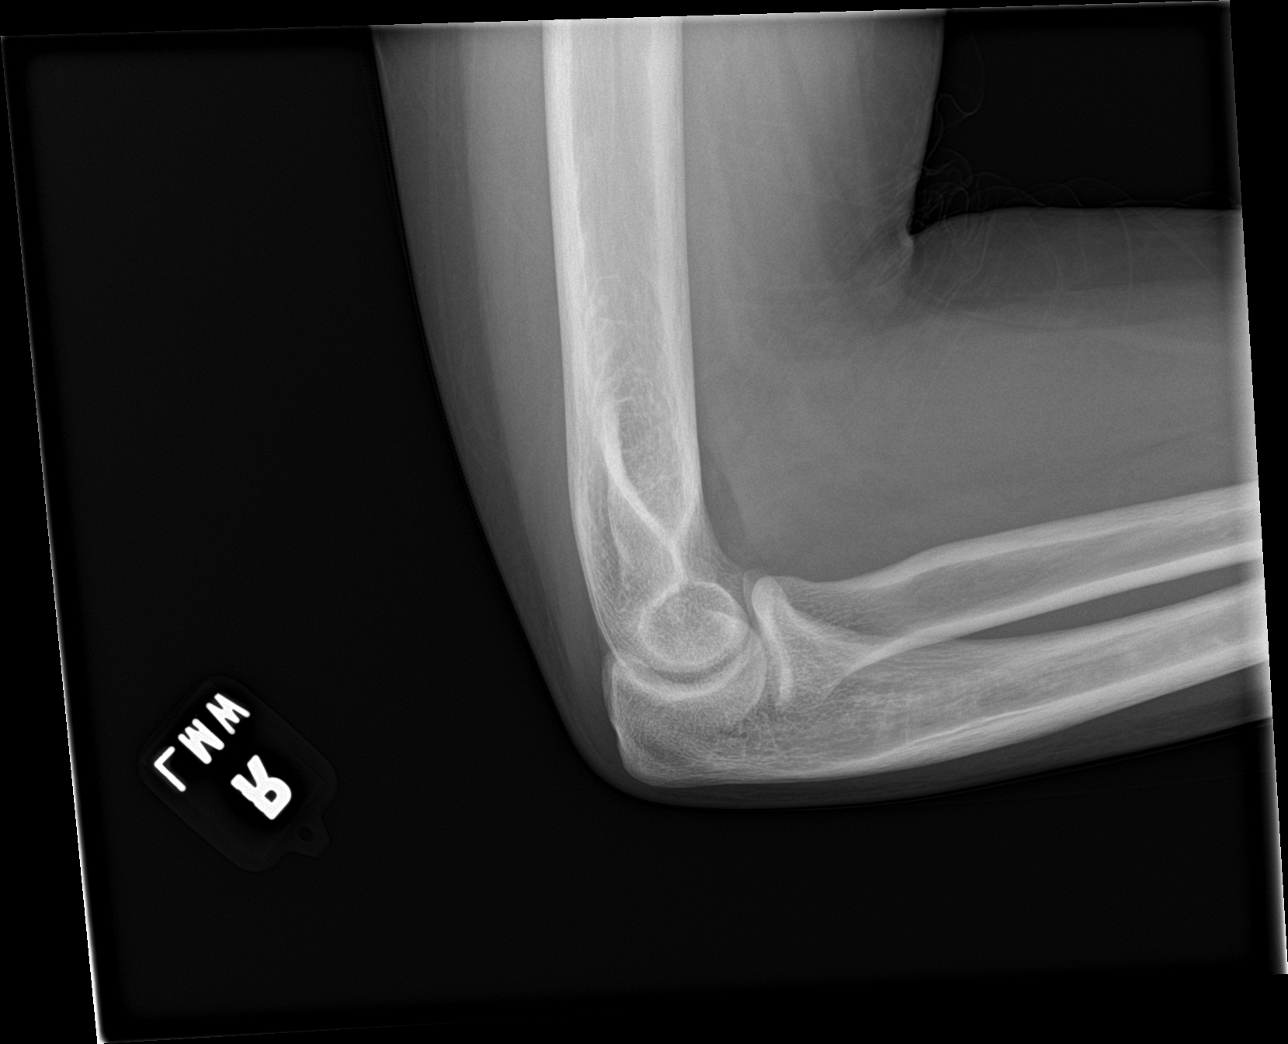

[4 of 4 positions shown; findings below may reference images not displayed]

FINDINGS: There is no evidence of fracture, dislocation, or joint effusion.
There is no evidence of arthropathy or other focal bone abnormality.
Soft tissues are unremarkable.
IMPRESSION: Negative.

## 2022-10-11 ENCOUNTER — Other Ambulatory Visit: Payer: Self-pay | Admitting: Podiatry

## 2022-10-19 ENCOUNTER — Ambulatory Visit: Payer: 59 | Admitting: Podiatry

## 2022-10-19 ENCOUNTER — Encounter: Payer: Self-pay | Admitting: Podiatry

## 2022-10-19 VITALS — BP 139/72 | HR 116

## 2022-10-19 DIAGNOSIS — R600 Localized edema: Secondary | ICD-10-CM | POA: Diagnosis not present

## 2022-10-19 DIAGNOSIS — M79604 Pain in right leg: Secondary | ICD-10-CM

## 2022-10-19 DIAGNOSIS — M79605 Pain in left leg: Secondary | ICD-10-CM

## 2022-10-19 NOTE — Progress Notes (Signed)
Chief Complaint  Patient presents with   Foot Pain    "I feel like it's getting worse.  I also have a rash all over, I think it may be from the cortisone shots."    Subjective: 27 y.o. female presenting today for follow-up evaluation of bilateral heel and foot and ankle pain.  Patient states that the injections only helped minimally.  After about a week the pain returned.  Today she is having pain and tenderness also into the legs.  Again, she is very frustrated due to the increased pain and tenderness that she has been experiencing on a daily basis.  In the past the patient has even gone to the emergency department to be evaluated because of the severe pain she is experiencing  Brief history: Foot and leg pain has been ongoing for about 2 years now.  She states that she has seen Dr. Ether Griffins at [facility] who recommended conservative stretching and icing with tennis ball massage.  She also had seen another physician in [place] but there was no treatment at that time.  Patient is very frustrated because she has had ongoing pain and tenderness for about 2 years now.   No past medical history on file.   LT leg 10/19/2022  RT leg 10/19/2022  Objective: Physical Exam General: The patient is alert and oriented x3 in no acute distress.  Dermatology: Skin is warm, dry and supple bilateral lower extremities.  Dark discoloration noted around the ankles of the bilateral lower extremities.  According the patient this is somewhat new over the past few years.  Possibly concerning for venous insufficiency.  No open wounds.  Vascular: Skin is warm to touch.  Capillary refill WNL  Neurological: Grossly intact via light touch Musculoskeletal: Tenderness to palpation to the plantar aspect of the bilateral heels along the plantar fascia.  Patient also has pain associated to the bilateral anterior legs and ankles.  All other joints range of motion within normal limits bilateral. Strength 5/5 in all  groups bilateral.   Radiographic exam B/L feet 08/07/2022: Normal osseous mineralization. Joint spaces preserved. No fracture/dislocation/boney destruction. No other soft tissue abnormalities or radiopaque foreign bodies.   CBC 12/18/2021    Component Value Date/Time   WBC 8.4 12/18/2021 1918   RBC 5.13 (H) 12/18/2021 1918   HGB 11.6 (L) 12/18/2021 1918   HCT 39.6 12/18/2021 1918   PLT 296 12/18/2021 1918   MCV 77.2 (L) 12/18/2021 1918   MCH 22.6 (L) 12/18/2021 1918   MCHC 29.3 (L) 12/18/2021 1918   RDW 13.2 12/18/2021 1918   LYMPHSABS 3.0 12/18/2021 1918   MONOABS 0.7 12/18/2021 1918   EOSABS 0.1 12/18/2021 1918   BASOSABS 0.0 12/18/2021 1918   CMP 12/18/2021    Component Value Date/Time   NA 140 12/18/2021 1918   K 3.7 12/18/2021 1918   CL 106 12/18/2021 1918   CO2 25 12/18/2021 1918   GLUCOSE 145 (H) 12/18/2021 1918   BUN 12 12/18/2021 1918   CREATININE 0.41 (L) 12/18/2021 1918   CALCIUM 9.3 12/18/2021 1918   PROT 7.8 12/18/2021 1918   ALBUMIN 4.0 12/18/2021 1918   AST 31 12/18/2021 1918   ALT 34 12/18/2021 1918   ALKPHOS 370 (H) 12/18/2021 1918   BILITOT 0.7 12/18/2021 1918   GFRNONAA >60 12/18/2021 1918    Assessment: 1.  Plantar fasciitis bilateral feet x 2 years 2.  Diffuse generalized foot ankle and leg pain bilateral 3.  Hemosiderin deposits and hyperpigmented discoloration bilateral  legs  -Patient evaluated.  Labs that were taken in the emergency department on 12/18/2021 were reviewed.  There is concern for possible microcytic anemia based on CBC with differentials taken concerning for possible sickle cell trait. -The pain and tenderness the patient is feeling seems to be more than just the heels.  I do not believe that she is currently experiencing plantar fasciitis.  I believe there is a different underlying etiology -Order placed for sickle cell panel at [facility] -Order also placed for venous Doppler.  -Order placed for ABIs bilateral lower extremity -Will  plan to contact the patient regarding the results and to discuss further treatment options  *grew up in [place]. 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 Part [place] and Ancil Boozer, DPM [facility]  Dr. Felecia Shelling, DPM    [address] [place], [place] [address]                Office [phone]  Fax [phone]

## 2022-10-20 LAB — CMP14+CBC/D/PLT+FER+RETIC+V...
ALT: 23 IU/L (ref 0–32)
AST: 25 IU/L (ref 0–40)
Albumin/Globulin Ratio: 1.3 (ref 1.2–2.2)
Albumin: 3.9 g/dL — ABNORMAL LOW (ref 4.0–5.0)
Alkaline Phosphatase: 382 IU/L — ABNORMAL HIGH (ref 44–121)
BUN/Creatinine Ratio: 27 — ABNORMAL HIGH (ref 9–23)
BUN: 12 mg/dL (ref 6–20)
Basophils Absolute: 0 10*3/uL (ref 0.0–0.2)
Basos: 0 %
Bilirubin Total: 0.4 mg/dL (ref 0.0–1.2)
CO2: 19 mmol/L — ABNORMAL LOW (ref 20–29)
Calcium: 9.6 mg/dL (ref 8.7–10.2)
Chloride: 108 mmol/L — ABNORMAL HIGH (ref 96–106)
Creatinine, Ser: 0.44 mg/dL — ABNORMAL LOW (ref 0.57–1.00)
EOS (ABSOLUTE): 0.1 10*3/uL (ref 0.0–0.4)
Eos: 1 %
Ferritin: 125 ng/mL (ref 15–150)
Globulin, Total: 3.1 g/dL (ref 1.5–4.5)
Glucose: 119 mg/dL — ABNORMAL HIGH (ref 70–99)
Hematocrit: 33.9 % — ABNORMAL LOW (ref 34.0–46.6)
Hemoglobin: 10.9 g/dL — ABNORMAL LOW (ref 11.1–15.9)
Immature Grans (Abs): 0 10*3/uL (ref 0.0–0.1)
Immature Granulocytes: 0 %
Lymphocytes Absolute: 3.2 10*3/uL — ABNORMAL HIGH (ref 0.7–3.1)
Lymphs: 40 %
MCH: 23.4 pg — ABNORMAL LOW (ref 26.6–33.0)
MCHC: 32.2 g/dL (ref 31.5–35.7)
MCV: 73 fL — ABNORMAL LOW (ref 79–97)
Monocytes Absolute: 0.7 10*3/uL (ref 0.1–0.9)
Monocytes: 9 %
Neutrophils Absolute: 4 10*3/uL (ref 1.4–7.0)
Neutrophils: 50 %
Platelets: 280 10*3/uL (ref 150–450)
Potassium: 4.2 mmol/L (ref 3.5–5.2)
RBC: 4.65 x10E6/uL (ref 3.77–5.28)
RDW: 12.7 % (ref 11.7–15.4)
Retic Ct Pct: 1.3 % (ref 0.6–2.6)
Sodium: 142 mmol/L (ref 134–144)
Total Protein: 7 g/dL (ref 6.0–8.5)
Vit D, 25-Hydroxy: 24 ng/mL — ABNORMAL LOW (ref 30.0–100.0)
WBC: 8.1 10*3/uL (ref 3.4–10.8)
eGFR: 138 mL/min/{1.73_m2} (ref >59)

## 2022-10-29 ENCOUNTER — Other Ambulatory Visit: Payer: Self-pay | Admitting: Podiatry

## 2022-10-29 DIAGNOSIS — D571 Sickle-cell disease without crisis: Secondary | ICD-10-CM

## 2022-10-29 DIAGNOSIS — M79604 Pain in right leg: Secondary | ICD-10-CM

## 2022-10-29 NOTE — Progress Notes (Signed)
Tried calling patient.  No answer.  Voicemail.  Left message stating that I am referring her to hematology here in [place].  Blood work suspicious for microcytic anemia/likely sickle cell anemia.  Will try to reach out to the patient again later in the week.  Felecia Shelling, DPM [facility]  Dr. Felecia Shelling, DPM    [address] [place], [place] [address]                Office [phone]  Fax [phone]

## 2022-11-21 ENCOUNTER — Other Ambulatory Visit: Payer: Self-pay | Admitting: Podiatry

## 2022-11-21 DIAGNOSIS — D571 Sickle-cell disease without crisis: Secondary | ICD-10-CM

## 2022-11-21 NOTE — Progress Notes (Signed)
Spoke with patient via telephone today.  I do believe the patient's symptoms are coming from sickle cell disease.  Sickle cell panel was reviewed today with the patient.  Referral placed to internal medicine, [place] [facility].  Patient will also reach out to them to set up an appointment in initial consultation.  Follow-up with me as needed.  Felecia Shelling, DPM [facility]  Dr. Felecia Shelling, DPM    [address] [place], [place] [address]                Office [phone]  Fax [phone]

## 2022-12-18 ENCOUNTER — Telehealth: Payer: Self-pay | Admitting: *Deleted

## 2022-12-18 NOTE — Telephone Encounter (Signed)
Sidney with patient care is calling to verify if the referral sent to them for sickle cell is for their primary doctor there who also treats the disease.Please advise.

## 2022-12-28 ENCOUNTER — Telehealth: Payer: Self-pay

## 2022-12-28 NOTE — Telephone Encounter (Signed)
Dr. Logan Bores called to refer pt to our office .  Pt was made an appointment .

## 2023-02-11 ENCOUNTER — Ambulatory Visit: Payer: Self-pay | Admitting: Nurse Practitioner

## 2023-03-19 ENCOUNTER — Emergency Department
Admission: EM | Admit: 2023-03-19 | Discharge: 2023-03-19 | Disposition: A | Discharge status: Home / Self Care | Payer: Self-pay | Attending: Emergency Medicine | Admitting: Emergency Medicine

## 2023-03-19 ENCOUNTER — Other Ambulatory Visit: Payer: Self-pay

## 2023-03-19 DIAGNOSIS — H6691 Otitis media, unspecified, right ear: Secondary | ICD-10-CM | POA: Insufficient documentation

## 2023-03-19 DIAGNOSIS — H6690 Otitis media, unspecified, unspecified ear: Secondary | ICD-10-CM

## 2023-03-19 MED ORDER — AMOXICILLIN-POT CLAVULANATE 875-125 MG PO TABS: 1.0000 | ORAL_TABLET | Freq: Two times a day (BID) | ORAL | 0 refills | Status: AC

## 2023-03-19 NOTE — ED Notes (Signed)
See triage notes. Patient c/o right ear pain times one week. Patient stated she has a bad ear infection.

## 2023-03-19 NOTE — Discharge Instructions (Addendum)
Please take the medication as prescribed to help with your ear infection.  Please follow-up with ENT if your symptoms persist.  Please return for any new, worsening, or change in symptoms or other concerns.  It was a pleasure caring for you today.

## 2023-03-19 NOTE — ED Provider Notes (Signed)
[facility] Provider Note    Event Date/Time   First MD Initiated Contact with Patient 03/19/23 0940     (approximate)   History   Ear Pain   HPI  Cassie Pham is a 27 y.o. female who presents today for evaluation of right pain.  Patient reports that this pain for approximately 1 week.  She has not noticed any discharge or drainage from her ear.  She denies nasal congestion, fevers, or chills.  No changes in hearing.  Patient Active Problem List   Diagnosis Date Noted   MDD (major depressive disorder), single episode 12/19/2021          Physical Exam   Triage Vital Signs: ED Triage Vitals  Encounter Vitals Group     BP 03/19/23 0910 (!) 167/80     Systolic BP Percentile --      Diastolic BP Percentile --      Pulse Rate 03/19/23 0910 (!) 115     Resp 03/19/23 0910 18     Temp 03/19/23 0910 98.3 F (36.8 C)     Temp Source 03/19/23 0910 Oral     SpO2 03/19/23 0910 98 %     Weight 03/19/23 0907 216 lb 14.9 oz (98.4 kg)     Height 03/19/23 0907 6\' 2"  (1.88 m)     Head Circumference --      Peak Flow --      Pain Score 03/19/23 0906 7     Pain Loc --      Pain Education --      Exclude from Growth Chart --     Most recent vital signs: Vitals:   03/19/23 0910 03/19/23 1010  BP: (!) 167/80 (!) 156/79  Pulse: (!) 115 (!) 103  Resp: 18 18  Temp: 98.3 F (36.8 C)   SpO2: 98% 100%    Physical Exam Vitals and nursing note reviewed.  Constitutional:      General: Awake and alert. No acute distress.    Appearance: Normal appearance. The patient is normal weight.  HENT:     Head: Normocephalic and atraumatic.     Mouth: Mucous membranes are moist.  Left TM clear, normal canal Right TM: bulging and erythematous. Normal canal.  No proptosis of pinna.  No mastoid tenderness or erythema Eyes:     General: PERRL. Normal EOMs        Right eye: No discharge.        Left eye: No discharge.     Conjunctiva/sclera: Conjunctivae normal.   Cardiovascular:     Rate and Rhythm: Normal rate and regular rhythm.     Pulses: Normal pulses.  Pulmonary:     Effort: Pulmonary effort is normal. No respiratory distress.     Breath sounds: Normal breath sounds.  Abdominal:     Abdomen is soft. There is no abdominal tenderness. No rebound or guarding. No distention. Musculoskeletal:        General: No swelling. Normal range of motion.     Cervical back: Normal range of motion and neck supple.  Skin:    General: Skin is warm and dry.     Capillary Refill: Capillary refill takes less than 2 seconds.     Findings: No rash.  Neurological:     Mental Status: The patient is awake and alert.      ED Results / Procedures / Treatments   Labs (all labs ordered are listed, but only abnormal results are displayed) Labs  Reviewed - No data to display   EKG     RADIOLOGY     PROCEDURES:  Critical Care performed:   Procedures   MEDICATIONS ORDERED IN ED: Medications - No data to display   IMPRESSION / MDM / ASSESSMENT AND PLAN / ED COURSE  I reviewed the triage vital signs and the nursing notes.   Differential diagnosis includes, but is not limited to, otitis externa, otitis media, less likely mastoiditis.  Patient is awake and alert, hemodynamically stable and afebrile.  She is nontoxic in appearance.  She has a bulging erythematous tympanic membrane consistent with otitis media.  Her canal is clear, no evidence of otitis externa.  There is no proptosis of pinna, no mastoid tenderness erythema, not consistent with mastoiditis.  She was started on antibiotics.  She was given a work note per her request.  We discussed return precautions and outpatient follow-up.  Patient or stands and agrees with plan.  She was discharged in stable condition.   Patient's presentation is most consistent with acute complicated illness / injury requiring diagnostic workup.     FINAL CLINICAL IMPRESSION(S) / ED DIAGNOSES   Final  diagnoses:  Acute otitis media, unspecified otitis media type     Rx / DC Orders   ED Discharge Orders          Ordered    amoxicillin-clavulanate (AUGMENTIN) 875-125 MG tablet  2 times daily        03/19/23 1024             Note:  This document was prepared using Dragon voice recognition software and may include unintentional dictation errors.   Jackelyn Hoehn, PA-C 03/19/23 1101    Sharyn Creamer, MD 03/19/23 [phone]

## 2023-03-19 NOTE — ED Triage Notes (Signed)
Right ear pain x 1 week 

## 2023-05-25 ENCOUNTER — Other Ambulatory Visit: Payer: Self-pay

## 2023-05-25 ENCOUNTER — Encounter: Payer: Self-pay | Admitting: Intensive Care

## 2023-05-25 ENCOUNTER — Emergency Department
Admission: EM | Admit: 2023-05-25 | Discharge: 2023-05-25 | Discharge status: Against Medical Advice | Payer: Self-pay | Attending: Student in an Organized Health Care Education/Training Program | Admitting: Student in an Organized Health Care Education/Training Program

## 2023-05-25 DIAGNOSIS — Z5321 Procedure and treatment not carried out due to patient leaving prior to being seen by health care provider: Secondary | ICD-10-CM | POA: Insufficient documentation

## 2023-05-25 DIAGNOSIS — H9201 Otalgia, right ear: Secondary | ICD-10-CM | POA: Insufficient documentation

## 2023-05-25 NOTE — ED Notes (Signed)
Pt up to nurses desk and states she is leaving and will come back tomorrow. Advised pt to stay and will see as soon as able, no agreeable. Ambulatory, NAD noted

## 2023-05-25 NOTE — ED Triage Notes (Addendum)
C/o right ear pain. Denies drainage. Seen for same here in October.  Patient reports she was prescribed blood pressure medicine in June but has no doctor and never got a refill, so has not taken in months.   Denies headache. Denies chest pain or sob

## 2023-05-26 ENCOUNTER — Other Ambulatory Visit: Payer: Self-pay

## 2023-05-26 ENCOUNTER — Emergency Department
Admission: EM | Admit: 2023-05-26 | Discharge: 2023-05-26 | Disposition: A | Discharge status: Home / Self Care | Payer: Self-pay | Attending: Student in an Organized Health Care Education/Training Program | Admitting: Student in an Organized Health Care Education/Training Program

## 2023-05-26 DIAGNOSIS — H6991 Unspecified Eustachian tube disorder, right ear: Secondary | ICD-10-CM | POA: Insufficient documentation

## 2023-05-26 MED ORDER — PREDNISONE 10 MG PO TABS: ORAL_TABLET | ORAL | 0 refills | Status: AC

## 2023-05-26 MED ORDER — FLUTICASONE PROPIONATE 50 MCG/ACT NA SUSP: 2.0000 | Freq: Every day | NASAL | 0 refills | Status: AC

## 2023-05-26 NOTE — ED Triage Notes (Signed)
Pt reports pain to her right ear since October. Pt reports was seen here for the same not long ago and was given amoxicillin. Pt states got better but now it is back and she thinks it is related to her wisdom teeth. Pt reports was here yesterday but left due to the wait time.

## 2023-05-26 NOTE — Discharge Instructions (Signed)
Call make an appointment with Dr. Okey Dupre who is on-call for [facility] if you are not seeing any improvement with medication.  The Flonase nasal sprays 2 sprays to each nostril once daily and the prednisone will begin with 6 tablets today and taper off by 1 tablet each day until you are completely finished.  You may take Tylenol as needed for pain.  Increase fluids to stay hydrated.

## 2023-05-26 NOTE — ED Provider Notes (Signed)
[facility] Provider Note    Event Date/Time   First MD Initiated Contact with Patient 05/26/23 [phone]     (approximate)   History   Otalgia   HPI  Cassie Pham is a 27 y.o. female   is to the ED with complaint of right ear pain since October.  Patient states she was seen in the emergency department and given amoxicillin which helped for a short time and now has same symptoms.  She denies any fever, chills, nausea or vomiting.  No URI symptoms.  No over-the-counter medication has been taken.      Physical Exam   Triage Vital Signs: ED Triage Vitals  Encounter Vitals Group     BP 05/26/23 0756 (!) 164/84     Systolic BP Percentile --      Diastolic BP Percentile --      Pulse Rate 05/26/23 0756 (!) 111     Resp 05/26/23 0756 16     Temp 05/26/23 0756 98.2 F (36.8 C)     Temp Source 05/26/23 0756 Oral     SpO2 05/26/23 0756 96 %     Weight 05/26/23 0754 200 lb 9.9 oz (91 kg)     Height 05/26/23 0754 6\' 2"  (1.88 m)     Head Circumference --      Peak Flow --      Pain Score 05/26/23 0753 7     Pain Loc --      Pain Education --      Exclude from Growth Chart --     Most recent vital signs: Vitals:   05/26/23 0756  BP: (!) 164/84  Pulse: (!) 111  Resp: 16  Temp: 98.2 F (36.8 C)  SpO2: 96%     General: Awake, no distress.  CV:  Good peripheral perfusion.  Resp:  Normal effort.  Abd:  No distention.  Other:  Left EAC and TM clear.  Right EAC is clear, TM is dull with effusion present.  No erythema or injection is noted.  Posterior pharynx without erythema or exudate.  Uvula is midline.  No dental abscess or tenderness noted.  Neck is supple without cervical lymphadenopathy.   ED Results / Procedures / Treatments   Labs (all labs ordered are listed, but only abnormal results are displayed) Labs Reviewed - No data to display     PROCEDURES:  Critical Care performed:   Procedures   MEDICATIONS ORDERED IN  ED: Medications - No data to display   IMPRESSION / MDM / ASSESSMENT AND PLAN / ED COURSE  I reviewed the triage vital signs and the nursing notes.   Differential diagnosis includes, but is not limited to, otitis externa, otitis media, eustachian tube dysfunction, referred pain from dental abscess/cavity, pharyngitis, tonsillitis.  27 year old female presents to the ED with complaint of right ear pain that was temporarily improved with amoxicillin in October.  Patient denies URI symptoms.  Physical exam is without an erythematous TM and most likely is resulting from eustachian tube dysfunction.  Patient is made aware that if there is no improvement with the medications prescribed, Flonase and prednisone she is to call make an appointment with Dr. Okey Dupre who is on-call for [facility].      Patient's presentation is most consistent with acute, uncomplicated illness.  FINAL CLINICAL IMPRESSION(S) / ED DIAGNOSES   Final diagnoses:  Eustachian tube dysfunction, right     Rx / DC Orders   ED Discharge Orders  Ordered    fluticasone (FLONASE) 50 MCG/ACT nasal spray  Daily        05/26/23 1017    predniSONE (DELTASONE) 10 MG tablet        05/26/23 1017             Note:  This document was prepared using Dragon voice recognition software and may include unintentional dictation errors.   Tommi Rumps, PA-C 05/26/23 1021    Willy Eddy, MD 05/26/23 [phone]

## 2024-06-29 ENCOUNTER — Ambulatory Visit: Payer: Self-pay

## 2024-07-07 ENCOUNTER — Ambulatory Visit: Payer: Self-pay | Admitting: Podiatry
